# Patient Record
Sex: Male | Born: 1955 | Race: Black or African American | Hispanic: No | Marital: Married | Smoking: Former smoker
Health system: Southern US, Community
[De-identification: ages and names within clinical notes are randomized; demographics above are authoritative.]

## PROBLEM LIST (undated history)

## (undated) DIAGNOSIS — R200 Anesthesia of skin: Secondary | ICD-10-CM

## (undated) DIAGNOSIS — I10 Essential (primary) hypertension: Secondary | ICD-10-CM

## (undated) DIAGNOSIS — E785 Hyperlipidemia, unspecified: Secondary | ICD-10-CM

## (undated) HISTORY — PX: SPINE SURGERY: SHX786

## (undated) HISTORY — DX: Anesthesia of skin: R20.0

## (undated) HISTORY — DX: Essential (primary) hypertension: I10

## (undated) HISTORY — PX: COLONOSCOPY: SHX174

## (undated) HISTORY — DX: Hyperlipidemia, unspecified: E78.5

---

## 2004-02-20 ENCOUNTER — Emergency Department (HOSPITAL_COMMUNITY): Admission: EM | Admit: 2004-02-20 | Discharge: 2004-02-20 | Payer: Self-pay | Admitting: Emergency Medicine

## 2004-05-13 ENCOUNTER — Emergency Department (HOSPITAL_COMMUNITY): Admission: EM | Admit: 2004-05-13 | Discharge: 2004-05-13 | Payer: Self-pay | Admitting: Emergency Medicine

## 2004-05-24 ENCOUNTER — Emergency Department (HOSPITAL_COMMUNITY): Admission: EM | Admit: 2004-05-24 | Discharge: 2004-05-24 | Payer: Self-pay | Admitting: Emergency Medicine

## 2005-10-23 ENCOUNTER — Emergency Department (HOSPITAL_COMMUNITY): Admission: EM | Admit: 2005-10-23 | Discharge: 2005-10-23 | Payer: Self-pay | Admitting: Emergency Medicine

## 2013-08-11 ENCOUNTER — Emergency Department (HOSPITAL_COMMUNITY)
Admission: EM | Admit: 2013-08-11 | Discharge: 2013-08-11 | Disposition: A | Discharge status: Home / Self Care | Payer: BC Managed Care – PPO | Attending: Emergency Medicine | Admitting: Emergency Medicine

## 2013-08-11 ENCOUNTER — Encounter (HOSPITAL_COMMUNITY): Payer: Self-pay | Admitting: Emergency Medicine

## 2013-08-11 DIAGNOSIS — M5431 Sciatica, right side: Secondary | ICD-10-CM

## 2013-08-11 DIAGNOSIS — M5430 Sciatica, unspecified side: Secondary | ICD-10-CM | POA: Insufficient documentation

## 2013-08-11 MED ORDER — NAPROXEN SODIUM 550 MG PO TABS: 550.0000 mg | ORAL_TABLET | Freq: Two times a day (BID) | ORAL | Status: DC

## 2013-08-11 MED ORDER — HYDROCODONE-ACETAMINOPHEN 5-325 MG PO TABS: 1.0000 | ORAL_TABLET | Freq: Four times a day (QID) | ORAL | Status: DC | PRN

## 2013-08-11 MED ORDER — METHOCARBAMOL 500 MG PO TABS: 500.0000 mg | ORAL_TABLET | Freq: Two times a day (BID) | ORAL | Status: DC

## 2013-08-11 NOTE — Discharge Instructions (Signed)
Sciatica °Sciatica is pain, weakness, numbness, or tingling along the path of the sciatic nerve. The nerve starts in the lower back and runs down the back of each leg. The nerve controls the muscles in the lower leg and in the back of the knee, while also providing sensation to the back of the thigh, lower leg, and the sole of your foot. Sciatica is a symptom of another medical condition. For instance, nerve damage or certain conditions, such as a herniated disk or bone spur on the spine, pinch or put pressure on the sciatic nerve. This causes the pain, weakness, or other sensations normally associated with sciatica. Generally, sciatica only affects one side of the body. °CAUSES  °· Herniated or slipped disc. °· Degenerative disk disease. °· A pain disorder involving the narrow muscle in the buttocks (piriformis syndrome). °· Pelvic injury or fracture. °· Pregnancy. °· Tumor (rare). °SYMPTOMS  °Symptoms can vary from mild to very severe. The symptoms usually travel from the low back to the buttocks and down the back of the leg. Symptoms can include: °· Mild tingling or dull aches in the lower back, leg, or hip. °· Numbness in the back of the calf or sole of the foot. °· Burning sensations in the lower back, leg, or hip. °· Sharp pains in the lower back, leg, or hip. °· Leg weakness. °· Severe back pain inhibiting movement. °These symptoms may get worse with coughing, sneezing, laughing, or prolonged sitting or standing. Also, being overweight may worsen symptoms. °DIAGNOSIS  °Your caregiver will perform a physical exam to look for common symptoms of sciatica. He or she may ask you to do certain movements or activities that would trigger sciatic nerve pain. Other tests may be performed to find the cause of the sciatica. These may include: °· Blood tests. °· X-rays. °· Imaging tests, such as an MRI or CT scan. °TREATMENT  °Treatment is directed at the cause of the sciatic pain. Sometimes, treatment is not necessary  and the pain and discomfort goes away on its own. If treatment is needed, your caregiver may suggest: °· Over-the-counter medicines to relieve pain. °· Prescription medicines, such as anti-inflammatory medicine, muscle relaxants, or narcotics. °· Applying heat or ice to the painful area. °· Steroid injections to lessen pain, irritation, and inflammation around the nerve. °· Reducing activity during periods of pain. °· Exercising and stretching to strengthen your abdomen and improve flexibility of your spine. Your caregiver may suggest losing weight if the extra weight makes the back pain worse. °· Physical therapy. °· Surgery to eliminate what is pressing or pinching the nerve, such as a bone spur or part of a herniated disk. °HOME CARE INSTRUCTIONS  °· Only take over-the-counter or prescription medicines for pain or discomfort as directed by your caregiver. °· Apply ice to the affected area for 20 minutes, 3 4 times a day for the first 48 72 hours. Then try heat in the same way. °· Exercise, stretch, or perform your usual activities if these do not aggravate your pain. °· Attend physical therapy sessions as directed by your caregiver. °· Keep all follow-up appointments as directed by your caregiver. °· Do not wear high heels or shoes that do not provide proper support. °· Check your mattress to see if it is too soft. A firm mattress may lessen your pain and discomfort. °SEEK IMMEDIATE MEDICAL CARE IF:  °· You lose control of your bowel or bladder (incontinence). °· You have increasing weakness in the lower back,   pelvis, buttocks, or legs. °· You have redness or swelling of your back. °· You have a burning sensation when you urinate. °· You have pain that gets worse when you lie down or awakens you at night. °· Your pain is worse than you have experienced in the past. °· Your pain is lasting longer than 4 weeks. °· You are suddenly losing weight without reason. °MAKE SURE YOU: °· Understand these  instructions. °· Will watch your condition. °· Will get help right away if you are not doing well or get worse. °Document Released: 04/18/2001 Document Revised: 10/24/2011 Document Reviewed: 09/03/2011 °ExitCare® Patient Information ©2014 [employer]. ° °

## 2013-08-11 NOTE — ED Notes (Signed)
Pt has been having back pain for the past two weeks and has been unable to move much at all. 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

## 2013-08-11 NOTE — ED Provider Notes (Signed)
CSN: [id number]     Arrival date & time 08/11/13  1420 History  This chart was scribed for non-physician practitioner, Etta Quill, NP-C working with Saddie Benders. Dorna Mai, MD by Einar Pheasant, ED scribe. This patient was seen in room TR05C/TR05C and the patient's care was started at 5:58 PM.    Chief Complaint  Patient presents with  . Back Pain   The history is provided by the patient. No language interpreter was used.   HPI Comments: Harold Hayes is a 58 y.o. male who presents to the Emergency Department complaining of gradual onset, worsening back pain that radiates down his right leg that started 2 weeks ago. Pt believes that his pain is due to his recent project at work. 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 Pt states that he has had prior back pain but its never been this bad. He reports taking Naproxen, Aleve PM with no relief. Denies any abdominal pain, nausea, emesis, fever, chills, bladder or bowel incontinence.   PCP: No PCP Per Patient   History reviewed. No pertinent past medical history. History reviewed. No pertinent past surgical history. History reviewed. No pertinent family history. History  Substance Use Topics  . Smoking status: Never Smoker   . Smokeless tobacco: Not on file  . Alcohol Use: No    Review of Systems  Constitutional: Negative for fever, chills and diaphoresis.  HENT: Negative for congestion.   Respiratory: Negative for cough and shortness of breath.   Cardiovascular: Negative for chest pain.  Gastrointestinal: Negative for nausea, vomiting, abdominal pain and diarrhea.  Musculoskeletal: Positive for back pain.  Neurological: Negative for weakness.  All other systems reviewed and are negative.   Allergies  Review of patient's allergies indicates no known allergies.  Home Medications   Current Outpatient Rx  Name  Route  Sig  Dispense  Refill  . gabapentin (NEURONTIN) 300  MG capsule   Oral   Take 300 mg by mouth daily as needed (pain).         Marland Kitchen ibuprofen (ADVIL,MOTRIN) 100 MG tablet   Oral   Take 200 mg by mouth every 6 (six) hours as needed for fever.         . naproxen (NAPROSYN) 250 MG tablet   Oral   Take 500 mg by mouth daily as needed for mild pain.         . traMADol (ULTRAM) 50 MG tablet   Oral   Take 50 mg by mouth every 6 (six) hours as needed.          BP 170/101  Pulse 95  Temp(Src) 97.7 F (36.5 C) (Oral)  Resp 18  SpO2 99%  Physical Exam  Nursing note and vitals reviewed. Constitutional: He is oriented to person, place, and time. He appears well-developed and well-nourished. No distress.  HENT:  Head: Normocephalic and atraumatic.  Eyes: EOM are normal.  Neck: Normal range of motion. Neck supple. No thyromegaly present.  Cardiovascular: Normal rate, regular rhythm and normal heart sounds.  Exam reveals no gallop and no friction rub.   No murmur heard. Pulmonary/Chest: Effort normal. No respiratory distress. He has no wheezes. He has no rales.  Abdominal: Soft. Bowel sounds are normal. He exhibits no distension. There is no tenderness. There is no rebound.  Musculoskeletal: Normal range of motion.  Lymphadenopathy:    He has no cervical adenopathy.  Neurological: He is alert and oriented to person,  place, and time.  Skin: Skin is warm and dry.  Psychiatric: He has a normal mood and affect. His behavior is normal.    ED Course  DIAGNOSTIC STUDIES: Oxygen Saturation is 99% on RA, normal by my interpretation.    COORDINATION OF CARE: 6:04 PM- Will prescribe muscle relaxer and narco for pain control to use as needed. Pt advised of plan for treatment and pt agrees.   Procedures (including critical care time) Labs Review Labs Reviewed - No data to display Imaging Review No results found.   EKG Interpretation None      MDM   Final diagnoses:  None     Low back pain with sciatica. No red flag  symtpoms. Muscle relaxant, anti-inflammatory. Return precautions discussed.  I personally performed the services described in this documentation, which was scribed in my presence. The recorded information has been reviewed and is accurate.    Norman Herrlich, NP 08/12/13 [phone]

## 2013-08-14 NOTE — ED Provider Notes (Signed)
Medical screening examination/treatment/procedure(s) were performed by non-physician practitioner and as supervising physician I was immediately available for consultation/collaboration.   EKG Interpretation None        Saddie Benders. Arisa Congleton, MD 08/14/13 1501

## 2013-09-13 ENCOUNTER — Emergency Department (HOSPITAL_COMMUNITY)
Admission: EM | Admit: 2013-09-13 | Discharge: 2013-09-13 | Disposition: A | Discharge status: Home / Self Care | Payer: BC Managed Care – PPO | Attending: Emergency Medicine | Admitting: Emergency Medicine

## 2013-09-13 ENCOUNTER — Encounter (HOSPITAL_COMMUNITY): Payer: Self-pay | Admitting: Emergency Medicine

## 2013-09-13 DIAGNOSIS — Z791 Long term (current) use of non-steroidal anti-inflammatories (NSAID): Secondary | ICD-10-CM | POA: Insufficient documentation

## 2013-09-13 DIAGNOSIS — Y9389 Activity, other specified: Secondary | ICD-10-CM | POA: Insufficient documentation

## 2013-09-13 DIAGNOSIS — M549 Dorsalgia, unspecified: Secondary | ICD-10-CM

## 2013-09-13 DIAGNOSIS — Y92410 Unspecified street and highway as the place of occurrence of the external cause: Secondary | ICD-10-CM | POA: Insufficient documentation

## 2013-09-13 DIAGNOSIS — S46909A Unspecified injury of unspecified muscle, fascia and tendon at shoulder and upper arm level, unspecified arm, initial encounter: Secondary | ICD-10-CM | POA: Insufficient documentation

## 2013-09-13 DIAGNOSIS — IMO0002 Reserved for concepts with insufficient information to code with codable children: Secondary | ICD-10-CM | POA: Insufficient documentation

## 2013-09-13 DIAGNOSIS — S4980XA Other specified injuries of shoulder and upper arm, unspecified arm, initial encounter: Secondary | ICD-10-CM | POA: Insufficient documentation

## 2013-09-13 DIAGNOSIS — Z79899 Other long term (current) drug therapy: Secondary | ICD-10-CM | POA: Insufficient documentation

## 2013-09-13 MED ORDER — OXYCODONE-ACETAMINOPHEN 5-325 MG PO TABS: 1.0000 | ORAL_TABLET | ORAL | Status: DC | PRN

## 2013-09-13 MED ORDER — METHOCARBAMOL 500 MG PO TABS: 500.0000 mg | ORAL_TABLET | Freq: Two times a day (BID) | ORAL | Status: DC | PRN

## 2013-09-13 NOTE — Discharge Instructions (Signed)
Take the prescribed medication as directed. Follow-up with a primary care physician.  If you do not have one a resource guide has been attached to help with this. Return to the ED for new or worsening symptoms.   Emergency Department Resource Guide 1) Find a Doctor and Pay Out of Pocket Although you won't have to find out who is covered by your insurance plan, it is a good idea to ask around and get recommendations. You will then need to call the office and see if the doctor you have chosen will accept you as a new patient and what types of options they offer for patients who are self-pay. Some doctors offer discounts or will set up payment plans for their patients who do not have insurance, but you will need to ask so you aren't surprised when you get to your appointment.  2) Contact Your Local Health Department Not all health departments have doctors that can see patients for sick visits, but many do, so it is worth a call to see if yours does. If you don't know where your local health department is, you can check in your phone book. The CDC also has a tool to help you locate your state's health department, and many state websites also have listings of all of their local health departments.  3) Find a [facility] If your illness is not likely to be very severe or complicated, you may want to try a walk in clinic. These are popping up all over the country in pharmacies, drugstores, and shopping centers. They're usually staffed by nurse practitioners or physician assistants that have been trained to treat common illnesses and complaints. They're usually fairly quick and inexpensive. However, if you have serious medical issues or chronic medical problems, these are probably not your best option.  No Primary Care Doctor: - Call Health Connect at  [phone] - they can help you locate a primary care doctor that  accepts your insurance, provides certain services, etc. - Physician Referral Service-  [phone]  Chronic Pain Problems: Organization         Address  Phone   Notes  [facility]  [phone] Patients need to be referred by their primary care doctor.   Medication Assistance: Organization         Address  Phone   Notes  [facility] Medication [facility] [address] [place], [place] [address] [phone] --Must be a resident of [facility] -- Must have NO insurance coverage whatsoever (no Medicaid/ Medicare, etc.) -- The pt. MUST have a primary care doctor that directs their care regularly and follows them in the community   MedAssist  [phone]   [employer]  [phone]    Agencies that provide inexpensive medical care: Organization         Address  Phone   Notes  [place]  [phone]   [facility]    [phone]   [facility] [place], [place] [address] [phone]   [place] [address], [place] [phone]   Planned Parenthood    [phone]   [facility]    [phone]   [place] and [address], [place] Phone:  [phone], Fax:  [phone] Hours of Operation:  9 am - 6 pm, M-F.  Also accepts Medicaid/Medicare and self-pay.  [facility] for [place], [address], [place] Phone: [phone], Fax: [phone]. Hours of Operation:  8:30 am - 5:30 pm, M-F.  Also accepts Medicaid and self-pay.  [facility] [place] [address] [place] Phone: [phone]   [place], [place], [place] [phone], Ext. [phone] Mondays & Thursdays: 7-9 AM.  First 15 patients are seen on a first come, first serve basis.    [place] Providers:  Organization         Address  Phone   Notes  [facility] [address],  [place] [phone] Also accepts self-pay patients.  [facility] [id number] [place], [place]  [phone]   [place], [address], [place] [phone]   [facility] [address] [place] [phone]   Lucianne Lei [address], [place]   [phone] Only accepts [place] Access [place] patients after they have their name applied to their card.   Self-Pay (no insurance) in [facility]:  Organization         Address  Phone   Notes  Sickle Cell Patients, [facility] [place] [phone]   [facility] [place] [phone]   [facility] [place]  [place], [place], [place] [phone]   [facility]/Dr. Osei-Bonsu  [address] [place] [address] [place] or [address], [place] [phone] Phone number for both [place] and [place] locations is the same.  Urgent Medical and [facility] [address], [place] [phone]   [facility] [address] [place] or [address] [phone]   [facility] [address], [place] [phone], phone; [phone], fax Sees patients 1st and 3rd Saturday of every month.  Must not qualify for public or private insurance (i.e. Medicaid, Medicare, [place] Health Choice, Veterans' Benefits)  Household income should be no more than 200% of the poverty level The clinic cannot treat you if you are pregnant or think you are pregnant  Sexually transmitted diseases are not treated at the clinic.    Dental Care: Organization         Address  Phone  Notes  [facility] Department of [facility] [place] [phone] Accepts children up to age 81 who are enrolled in  [place] or [place]; pregnant women with a Medicaid card; and children who have applied for Medicaid or [place] Health Choice, but were declined, whose parents can pay a reduced fee at time of service.  [facility] Department of [facility]  [address], [place] [phone] Accepts children up to age 44 who are enrolled in [place] or [place]; pregnant women with a Medicaid card; and children who have applied for Medicaid or  Health Choice, but were declined, whose parents can pay a reduced fee at time of service.  [facility] PRO[facility]  [place] [phone] Patients are seen by appointment only. Walk-ins are not accepted. [place] will see patients 52 years of age and older. Monday - Tuesday (8am-5pm) Most Wednesdays (8:30-5pm) $30 per  visit, cash only  [facility] Adult [employer] PROGRAM  [address], [address] [phone] Patients are seen by appointment only. Walk-ins are not accepted. [place] will see patients 48 years of age and older. One Wednesday Evening (Monthly: Volunteer Based).  $30 per visit, cash only  [place]  [phone] for adults; Children under age 62, call Graduate Pediatric Dentistry at [phone]. Children aged 41-14, please call [phone] to request a pediatric application.  Dental services are provided in all areas of dental care including fillings, crowns and bridges, complete and partial dentures, implants, gum treatment, root canals, and extractions. Preventive care is also provided. Treatment is provided to both adults and children. Patients are selected via a lottery and there is often a waiting list.   [facility] [address] [place]  [phone] [email or web address]   [facility] [address] [place], [place] [phone], Ext. [phone] Second and Fourth Thursday of each month, opens at 6:30  AM; Clinic ends at 9 AM.  Patients are seen on a first-come first-served basis, and a limited number are seen during each clinic.   [facility]  [address] Hillard Danker [place], [place] [phone]   Eligibility Requirements You must have lived in [place], [place], or [place] counties for at least the last three months.   You cannot be eligible for state or federal sponsored [employer], including [employer], [place], or [employer].   You generally cannot be eligible for healthcare insurance through your employer.    How to apply: Eligibility screenings are held every Tuesday and Wednesday afternoon from 1:00 pm until 4:00 pm. You do not need an appointment for the interview!  [facility] [address], [place], [place]   [place]  [place] Department  [place]  [phone]    Behavioral Health Resources in the Community: Intensive Outpatient Programs Organization         Address  Phone  Notes  [place]. [address] [place], [place] [phone]   [facility] Outpatient [address] [place], [place]   ADS: Alcohol & Drug Svcs [address] [place], [place]   [place] [address]  [place], [place] or [phone]   Substance Abuse Resources Organization         Address  Phone  Notes  Alcohol and Drug Services  [phone]   [place]  [phone]   [facility] [place]   Chinita Pester  [phone]   Residential & Outpatient Substance Abuse Program  [phone]   Psychological Services Organization         Address  Phone  Notes  [facility] [employer] [facility] [place]  [place]  [phone]   [place] [address] [place] or  [phone]    Mobile Crisis Teams Organization         Address  Phone  Notes  [facility], Mobile Crisis Care Unit  [phone]   [facility]  [address] [place], [place]   [place] [address], [place] [phone]    Self-Help/Support Groups Organization         Address  Phone  Notes  Mental Health Assoc. of [place] - variety of support groups  [place] Call for more information  Narcotics Anonymous (NA), [facility] [address], [employer] [place]  2 meetings at this location   Special educational needs teacher         Address  Phone  Notes  ASAP Residential Treatment [place],    [place]  [phone]   [facility]  [address] [place] [phone], [place], [place]   [place], [place] [phone] Admissions: 8am-3pm M-F  Incentives Substance [place] [address],    [place], [place] [phone]   [facility] [address] [place], [place], [place]   [facility] [address]  [place], [place]   [facility] - Intensive Outpatient [address] Kristeen Mans [address], [place], [place]   [facility] [place].,  [place], [place] [phone] or [phone]   [facility] Services (RTS) [address], [place], [place] Accepts Medicaid  [facility] [place] [address]  [place] [phone] Substance Abuse/Addiction Treatment   [facility] Organization         Address  Phone  Notes  [facility]  [phone]   Domenic Schwab, PhD [address] Arlis [place], [place]   [phone] or [phone]   [place], [place] [phone]   [facility] [address],  [place], [place] [phone] Insurance/Medicaid/sponsorship through [facility] and Families [address]                                    [place], [place] [phone] [place] [address] [place], [place] [phone]    Dr. Adele Schilder  [phone]   [facility] Dept. 1) [address] [place] 2) [place] 3)  [place] [address], [place] [phone]  [phone]   [place] [phone] or [phone] (After Hours)
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
## 2013-09-13 NOTE — ED Provider Notes (Signed)
Medical screening examination/treatment/procedure(s) were performed by non-physician practitioner and as supervising physician I was immediately available for consultation/collaboration.   EKG Interpretation None        Blanchard Kelch, MD 09/13/13 1530
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
## 2015-07-12 ENCOUNTER — Emergency Department (HOSPITAL_COMMUNITY)
Admission: EM | Admit: 2015-07-12 | Discharge: 2015-07-12 | Disposition: A | Discharge status: Home / Self Care | Payer: No Typology Code available for payment source | Attending: Emergency Medicine | Admitting: Emergency Medicine

## 2015-07-12 ENCOUNTER — Encounter (HOSPITAL_COMMUNITY): Payer: Self-pay | Admitting: Emergency Medicine

## 2015-07-12 DIAGNOSIS — Z79899 Other long term (current) drug therapy: Secondary | ICD-10-CM | POA: Diagnosis not present

## 2015-07-12 DIAGNOSIS — Y9389 Activity, other specified: Secondary | ICD-10-CM | POA: Diagnosis not present

## 2015-07-12 DIAGNOSIS — S161XXA Strain of muscle, fascia and tendon at neck level, initial encounter: Secondary | ICD-10-CM | POA: Diagnosis not present

## 2015-07-12 DIAGNOSIS — S3992XA Unspecified injury of lower back, initial encounter: Secondary | ICD-10-CM | POA: Diagnosis present

## 2015-07-12 DIAGNOSIS — I159 Secondary hypertension, unspecified: Secondary | ICD-10-CM | POA: Insufficient documentation

## 2015-07-12 DIAGNOSIS — S29002A Unspecified injury of muscle and tendon of back wall of thorax, initial encounter: Secondary | ICD-10-CM | POA: Insufficient documentation

## 2015-07-12 DIAGNOSIS — Z791 Long term (current) use of non-steroidal anti-inflammatories (NSAID): Secondary | ICD-10-CM | POA: Insufficient documentation

## 2015-07-12 DIAGNOSIS — Y92410 Unspecified street and highway as the place of occurrence of the external cause: Secondary | ICD-10-CM | POA: Insufficient documentation

## 2015-07-12 DIAGNOSIS — Y998 Other external cause status: Secondary | ICD-10-CM | POA: Diagnosis not present

## 2015-07-12 DIAGNOSIS — S39012A Strain of muscle, fascia and tendon of lower back, initial encounter: Secondary | ICD-10-CM

## 2015-07-12 MED ORDER — HYDROCODONE-ACETAMINOPHEN 5-325 MG PO TABS
1.0000 | ORAL_TABLET | Freq: Once | ORAL | Status: AC
  Administered 2015-07-12: 1 via ORAL
  Filled 2015-07-12: qty 1

## 2015-07-12 MED ORDER — METHOCARBAMOL 500 MG PO TABS: 500.0000 mg | ORAL_TABLET | Freq: Two times a day (BID) | ORAL | Status: DC

## 2015-07-12 MED ORDER — HYDROCODONE-ACETAMINOPHEN 5-325 MG PO TABS
1.0000 | ORAL_TABLET | Freq: Four times a day (QID) | ORAL | Status: DC | PRN
Start: 2015-07-12 — End: 2016-06-20

## 2015-07-12 MED ORDER — NAPROXEN 500 MG PO TABS: 500.0000 mg | ORAL_TABLET | Freq: Two times a day (BID) | ORAL | Status: DC

## 2015-07-12 MED ORDER — METHOCARBAMOL 500 MG PO TABS
750.0000 mg | ORAL_TABLET | Freq: Once | ORAL | Status: AC
  Administered 2015-07-12: 750 mg via ORAL
  Filled 2015-07-12: qty 2

## 2015-07-12 MED ORDER — IBUPROFEN 200 MG PO TABS
600.0000 mg | ORAL_TABLET | Freq: Once | ORAL | Status: AC
  Administered 2015-07-12: 600 mg via ORAL
  Filled 2015-07-12: qty 3
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
## 2016-06-20 ENCOUNTER — Encounter (INDEPENDENT_AMBULATORY_CARE_PROVIDER_SITE_OTHER): Payer: Self-pay

## 2016-06-20 ENCOUNTER — Ambulatory Visit (INDEPENDENT_AMBULATORY_CARE_PROVIDER_SITE_OTHER): Payer: 59 | Admitting: Pulmonary Disease

## 2016-06-20 ENCOUNTER — Encounter: Payer: Self-pay | Admitting: Pulmonary Disease

## 2016-06-20 ENCOUNTER — Ambulatory Visit (HOSPITAL_COMMUNITY)
Admission: RE | Admit: 2016-06-20 | Discharge: 2016-06-20 | Disposition: A | Discharge status: Home / Self Care | Payer: 59 | Source: Ambulatory Visit | Attending: Family Medicine | Admitting: Family Medicine

## 2016-06-20 VITALS — BP 192/100 | HR 85 | Temp 97.5°F | Ht 72.0 in | Wt 276.9 lb

## 2016-06-20 DIAGNOSIS — Z833 Family history of diabetes mellitus: Secondary | ICD-10-CM | POA: Diagnosis not present

## 2016-06-20 DIAGNOSIS — R202 Paresthesia of skin: Secondary | ICD-10-CM

## 2016-06-20 DIAGNOSIS — Z87891 Personal history of nicotine dependence: Secondary | ICD-10-CM | POA: Diagnosis not present

## 2016-06-20 DIAGNOSIS — I10 Essential (primary) hypertension: Secondary | ICD-10-CM | POA: Diagnosis not present

## 2016-06-20 DIAGNOSIS — R200 Anesthesia of skin: Secondary | ICD-10-CM

## 2016-06-20 DIAGNOSIS — Z8249 Family history of ischemic heart disease and other diseases of the circulatory system: Secondary | ICD-10-CM | POA: Diagnosis not present

## 2016-06-20 DIAGNOSIS — Z0000 Encounter for general adult medical examination without abnormal findings: Secondary | ICD-10-CM | POA: Insufficient documentation

## 2016-06-20 DIAGNOSIS — Z1211 Encounter for screening for malignant neoplasm of colon: Secondary | ICD-10-CM

## 2016-06-20 HISTORY — DX: Anesthesia of skin: R20.0

## 2016-06-20 MED ORDER — LISINOPRIL-HYDROCHLOROTHIAZIDE 20-12.5 MG PO TABS: 1.0000 | ORAL_TABLET | Freq: Every day | ORAL | 0 refills | Status: DC

## 2016-06-20 NOTE — Patient Instructions (Signed)
Start taking lisinopril-hydrochlorothiazide once a day Follow up in 2 weeks

## 2016-06-20 NOTE — Assessment & Plan Note (Signed)
Colonoscopy referral placed

## 2016-06-20 NOTE — Progress Notes (Signed)
   CC: Arm numbness  HPI:  [name] is a 61 y.o. man with history of elevated blood pressure presenting with intermittent arm numbness.  He gets intermittent bilateral arm paresthesias. Located in upper arm and down to his fingers. Occasionally lasts a few hours. He has to rest and sometimes even leans over when it happens. Feels like a jolt. No chest pain. He does get dyspneic. Does not have it currently. Does not notice it to be positional. This has been going on for two years. Comes and goes...every few weeks. He does maintenance work.    Past Medical History:  Diagnosis Date  . Hypertension    No past surgical history on file.  Family History  Problem Relation Age of Onset  . Diabetes Mother   . Diabetes Sister   . Hypertension Daughter   . Diabetes Sister     Social History   Social History  . Marital status: Married    Spouse name: N/A  . Number of children: N/A  . Years of education: N/A   Social History Main Topics  . Smoking status: Former Smoker    Packs/day: 1.50    Years: 20.00    Types: Cigarettes    Quit date: 05/08/1988  . Smokeless tobacco: Never Used  . Alcohol use No  . 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 Sexual activity: Yes    Birth control/ protection: None   Other Topics Concern  . None   Social History Narrative  . None    Review of Systems:   Constitutional: no fevers/chills, no weight loss Eyes: no vision changes Ears, nose, mouth, throat, and face: no cough Respiratory: see HPI Cardiovascular: no chest pain Gastrointestinal: no nausea/vomiting, no abdominal pain, no constipation, no diarrhea Genitourinary: no dysuria, no hematuria Integument: no rash Hematologic/lymphatic: no bleeding/bruising, no edema Musculoskeletal: no arthralgias, no myalgias Neurological: see HPI   Physical Exam:  Vitals:   06/20/16 1538  BP: (!) 192/100  Pulse: 85  Temp: 97.5 F (36.4 C)  TempSrc: Oral    SpO2: 100%  Weight: 276 lb 14.4 oz (125.6 kg)  Height: 6' (1.829 m)   General Apperance: NAD HEENT: Normocephalic, atraumatic, anicteric sclera Neck: Supple, trachea midline Lungs: Clear to auscultation bilaterally. No wheezes, rhonchi or rales. Breathing comfortably Heart: Regular rate and rhythm, no murmur/rub/gallop Abdomen: Soft, nontender, nondistended, no rebound/guarding Extremities: Warm and well perfused, no edema Skin: No rashes or lesions Neurologic: Alert and interactive. Strength 5/5 bilateral upper and lower extremities. Negative Spurling test. Sensation intact bilaterally.   I independently reviewed his EKG which snows normal sinus rhythm, left ventricular hypertrophy, ST elevation in V1-2 that looks like J point elevation. No past EKG for comparison  Assessment & Plan:   See Encounters Tab for problem based charting.  Patient discussed with Dr. Lynnae January

## 2016-06-20 NOTE — Assessment & Plan Note (Signed)
Assessment: BP 192/100 today. He has been hypertensive in the past on visits to the ED.  Plan: Start lisinopril-hydrochlorothizide 20-12.5mg  daily. Briefly discussed DASH diet and weight loss. Will discuss further at follow up BMP Follow up in 2 weeks

## 2016-06-20 NOTE — Assessment & Plan Note (Addendum)
Assessment: He gets intermittent bilateral arm paresthesias. Assessment: Bilateral arm paresthesias that are intermittent and occur once every week or so and nonpositional. No chest pain. He does get dyspneic. This has been going on for two years. He does maintenance work. No trauma to cervical spine. No focal deficits on neuro exam today. EKG without acute ischemic changes.  Plan: Observe for now. Will treat hypertension.

## 2016-06-21 ENCOUNTER — Encounter: Payer: Self-pay | Admitting: Gastroenterology

## 2016-06-21 LAB — BMP8+ANION GAP
Anion Gap: 18 mmol/L (ref 10.0–18.0)
BUN/Creatinine Ratio: 8 — ABNORMAL LOW (ref 10–24)
BUN: 10 mg/dL (ref 8–27)
CO2: 22 mmol/L (ref 18–29)
Calcium: 9 mg/dL (ref 8.6–10.2)
Chloride: 102 mmol/L (ref 96–106)
Creatinine, Ser: 1.18 mg/dL (ref 0.76–1.27)
GFR calc Af Amer: 77 mL/min/{1.73_m2} (ref >59)
GFR calc non Af Amer: 67 mL/min/{1.73_m2} (ref >59)
Glucose: 89 mg/dL (ref 65–99)
Potassium: 4.1 mmol/L (ref 3.5–5.2)
Sodium: 142 mmol/L (ref 134–144)

## 2016-06-23 NOTE — Progress Notes (Signed)
Internal Medicine Clinic Attending  Case discussed with Dr. Krall soon after the resident saw the patient.  We reviewed the resident's history and exam and pertinent patient test results.  I agree with the assessment, diagnosis, and plan of care documented in the resident's note. 

## 2016-07-12 ENCOUNTER — Ambulatory Visit (INDEPENDENT_AMBULATORY_CARE_PROVIDER_SITE_OTHER): Payer: 59 | Admitting: Internal Medicine

## 2016-07-12 ENCOUNTER — Ambulatory Visit (HOSPITAL_COMMUNITY)
Admission: RE | Admit: 2016-07-12 | Discharge: 2016-07-12 | Disposition: A | Discharge status: Home / Self Care | Payer: 59 | Source: Ambulatory Visit | Attending: Internal Medicine | Admitting: Internal Medicine

## 2016-07-12 VITALS — BP 123/64 | HR 90 | Temp 97.7°F | Ht 72.0 in | Wt 266.8 lb

## 2016-07-12 DIAGNOSIS — R202 Paresthesia of skin: Secondary | ICD-10-CM | POA: Insufficient documentation

## 2016-07-12 DIAGNOSIS — Z87891 Personal history of nicotine dependence: Secondary | ICD-10-CM

## 2016-07-12 DIAGNOSIS — Z79899 Other long term (current) drug therapy: Secondary | ICD-10-CM | POA: Diagnosis not present

## 2016-07-12 DIAGNOSIS — R200 Anesthesia of skin: Secondary | ICD-10-CM | POA: Insufficient documentation

## 2016-07-12 DIAGNOSIS — M4802 Spinal stenosis, cervical region: Secondary | ICD-10-CM | POA: Diagnosis not present

## 2016-07-12 DIAGNOSIS — M47892 Other spondylosis, cervical region: Secondary | ICD-10-CM | POA: Diagnosis not present

## 2016-07-12 DIAGNOSIS — I10 Essential (primary) hypertension: Secondary | ICD-10-CM

## 2016-07-12 NOTE — Patient Instructions (Signed)
We will check some labs and do xray of your spine to figure out why you are having the arm numbness.  Keep taking your meds for now.  Follow up in 2 weeks to recheck.

## 2016-07-12 NOTE — Assessment & Plan Note (Addendum)
Unclear etiology, going on for 2-3 years but more frequent. Lasts only few mins and goes away with rest. ?whether neck movement worsens or helps.  ddx include cspine stenosis vs polyneuropathy from metabolic or infectious problems   -cspine Xray -neuropathy labs TSH, b12, MMA, RPR, HIV, Hep C -f/up in 2 weeks. If all negative, may consider MRI cspine +/- brain imaging.   Addendum: labs normal. 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 Discussed results with the patient and offered him physical therapy as the first step. He decided to wait for now and will let us know if it gets too bothersome.

## 2016-07-12 NOTE — Progress Notes (Signed)
   CC: b/l hand numbness  HPI:  [name] is a 61 y.o. with PMH as listed below presents with b/l hand numbness  Past Medical History:  Diagnosis Date  . Hypertension   \ Has b/l arm numbness for last 2-3 years which used to be infrequent in the past but becoming more frequent. He gets these episodes suddenly when he gets b/l arm numbness/tingling radiating down to his hands. He rests when it happens and symptoms resolve within few mins. Since last visit 06/20/16 he is having more frequent episodes Was started on lisinopril-hctz last visit due to high BP. No neck pain, no neck or head or shoulder injuries. No new meds other than BP meds. No head injury. 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 No dizziness or HA or vision changes.    Review of Systems:    Review of Systems  Eyes: Negative for blurred vision and double vision.  Cardiovascular: Negative for chest pain and palpitations.  Gastrointestinal: Negative for heartburn, nausea and vomiting.  Genitourinary: Negative for dysuria.  Neurological: Positive for tingling and focal weakness. Negative for dizziness and headaches.     Physical Exam:  Vitals:   07/12/16 1546  BP: 123/64  Pulse: 90  Temp: 97.7 F (36.5 C)  TempSrc: Oral  SpO2: 100%  Weight: 266 lb 12.8 oz (121 kg)  Height: 6' (1.829 m)   Physical Exam  Constitutional: He is oriented to person, place, and time. He appears well-developed and well-nourished.  HENT:  Head: Normocephalic and atraumatic.  Eyes: EOM are normal. Pupils are equal, round, and reactive to light. Right eye exhibits no discharge. Left eye exhibits no discharge.  Mild Left scleral injection.   Neck: Normal range of motion. No JVD present. No tracheal deviation present. No thyromegaly present.  Cardiovascular: Normal rate and regular rhythm.  Exam reveals no gallop and no friction rub.   No murmur heard. Respiratory: Effort normal and breath  sounds normal. No respiratory distress. He has no wheezes.  GI: Soft. Bowel sounds are normal. He exhibits no distension.  Musculoskeletal: Normal range of motion. He exhibits no edema, tenderness or deformity.  Lymphadenopathy:    He has no cervical adenopathy.  Neurological: He is alert and oriented to person, place, and time.  Sensation intact b/l. Good ROM of shoulder, elbow, and neck joints. No tenderness over these joints. No tenderness over Cspine or neck. Good strength throughout.     Assessment & Plan:   See Encounters Tab for problem based charting.  Patient discussed with Dr. Lynnae January

## 2016-07-14 NOTE — Progress Notes (Signed)
Internal Medicine Clinic Attending  Case discussed with Dr. Ahmed at the time of the visit.  We reviewed the resident's history and exam and pertinent patient test results.  I agree with the assessment, diagnosis, and plan of care documented in the resident's note. 

## 2016-07-15 LAB — METHYLMALONIC ACID, SERUM: Methylmalonic Acid: 185 nmol/L (ref 0–378)

## 2016-07-15 LAB — RPR: RPR Ser Ql: NONREACTIVE

## 2016-07-15 LAB — HIV ANTIBODY (ROUTINE TESTING W REFLEX): HIV Screen 4th Generation wRfx: NONREACTIVE

## 2016-07-15 LAB — TSH: TSH: 2.13 u[IU]/mL (ref 0.450–4.500)

## 2016-07-15 LAB — VITAMIN B12: Vitamin B-12: 317 pg/mL (ref 232–1245)

## 2016-07-15 LAB — HEPATITIS C ANTIBODY: Hep C Virus Ab: <0.1 s/co ratio (ref 0.0–0.9)

## 2016-07-17 MED ORDER — LISINOPRIL-HYDROCHLOROTHIAZIDE 20-12.5 MG PO TABS: 1.0000 | ORAL_TABLET | Freq: Every day | ORAL | 2 refills | Status: DC

## 2016-07-17 NOTE — Addendum Note (Signed)
Addended by: Dellia Nims on: 07/17/2016 09:06 AM   Modules accepted: Orders

## 2016-07-17 NOTE — Assessment & Plan Note (Signed)
Refilled BP med.

## 2016-07-26 ENCOUNTER — Ambulatory Visit: Payer: 59

## 2016-08-02 ENCOUNTER — Ambulatory Visit: Payer: 59

## 2016-08-16 ENCOUNTER — Ambulatory Visit (AMBULATORY_SURGERY_CENTER): Payer: Self-pay

## 2016-08-16 VITALS — Ht 72.0 in | Wt 267.4 lb

## 2016-08-16 DIAGNOSIS — Z1211 Encounter for screening for malignant neoplasm of colon: Secondary | ICD-10-CM

## 2016-08-16 MED ORDER — NA SULFATE-K SULFATE-MG SULF 17.5-3.13-1.6 GM/177ML PO SOLN: 1.0000 | Freq: Once | ORAL | 0 refills | Status: AC

## 2016-08-16 NOTE — Progress Notes (Signed)
Denies allergies to eggs or soy products. Denies complication of anesthesia or sedation. Denies use of weight loss medication. Denies use of O2.   Emmi instructions declined does not have email.

## 2016-08-17 ENCOUNTER — Encounter: Payer: Self-pay | Admitting: Gastroenterology

## 2016-08-30 ENCOUNTER — Ambulatory Visit: Payer: 59 | Admitting: Gastroenterology

## 2016-08-30 ENCOUNTER — Encounter: Payer: Self-pay | Admitting: Gastroenterology

## 2016-08-30 VITALS — BP 91/67 | HR 60 | Temp 97.3°F | Resp 11 | Ht 72.0 in | Wt 267.0 lb

## 2016-08-30 DIAGNOSIS — Z1211 Encounter for screening for malignant neoplasm of colon: Secondary | ICD-10-CM | POA: Diagnosis not present

## 2016-08-30 DIAGNOSIS — D123 Benign neoplasm of transverse colon: Secondary | ICD-10-CM | POA: Diagnosis not present

## 2016-08-30 DIAGNOSIS — D122 Benign neoplasm of ascending colon: Secondary | ICD-10-CM | POA: Diagnosis not present

## 2016-08-30 DIAGNOSIS — D125 Benign neoplasm of sigmoid colon: Secondary | ICD-10-CM | POA: Diagnosis not present

## 2016-08-30 MED ORDER — SODIUM CHLORIDE 0.9 % IV SOLN: 500.0000 mL | INTRAVENOUS | Status: DC

## 2016-08-30 NOTE — Progress Notes (Signed)
Called to room to assist during endoscopic procedure.  Patient ID and intended procedure confirmed with present staff. Received instructions for my participation in the procedure from the performing physician.  

## 2016-08-30 NOTE — Op Note (Signed)
[facility] Patient Name: Harold Hayes Procedure Date: 08/30/2016 10:44 AM MRN: [id number] Endoscopist: Rachael Fee , MD Age: 61 Referring MD:  Date of Birth: [birth year] Gender: Male Account #: [id number] Procedure:                Colonoscopy Indications:              Screening for colorectal malignant neoplasm Medicines:                Monitored Anesthesia Care Procedure:                Pre-Anesthesia Assessment:                           - Prior to the procedure, a History and Physical                            was performed, and patient medications and                            allergies were reviewed. The patient's tolerance of                            previous anesthesia was also reviewed. The risks                            and benefits of the procedure and the sedation                            options and risks were discussed with the patient.                            All questions were answered, and informed consent                            was obtained. Prior Anticoagulants: The patient has                            taken no previous anticoagulant or antiplatelet                            agents. ASA Grade Assessment: II - A patient with                            mild systemic disease. After reviewing the risks                            and benefits, the patient was deemed in                            satisfactory condition to undergo the procedure.                           After obtaining informed consent, the colonoscope  was passed under direct vision. Throughout the                            procedure, the patient's blood pressure, pulse, and                            oxygen saturations were monitored continuously. The                            Colonoscope was introduced through the anus and                            advanced to the the cecum, identified by                            appendiceal orifice and  ileocecal valve. The                            colonoscopy was performed without difficulty. The                            patient tolerated the procedure well. The quality                            of the bowel preparation was excellent. The                            ileocecal valve, appendiceal orifice, and rectum                            were photographed. Scope In: 10:53:52 AM Scope Out: 11:05:15 AM Scope Withdrawal Time: 0 hours 8 minutes 29 seconds  Total Procedure Duration: 0 hours 11 minutes 23 seconds  Findings:                 Two sessile polyps were found in the transverse                            colon and ascending colon. The polyps were 3 to 5                            mm in size. These polyps were removed with a cold                            snare. Resection and retrieval were complete.                           A 7 mm polyp was found in the sigmoid colon. The                            polyp was semi-pedunculated. The polyp was removed                            with a hot  snare. Resection and retrieval were                            complete.                           Multiple small and large-mouthed diverticula were                            found in the entire colon.                           The exam was otherwise without abnormality on                            direct and retroflexion views. Complications:            No immediate complications. Estimated blood loss:                            None. Estimated Blood Loss:     Estimated blood loss: none. Impression:               - Two 3 to 5 mm polyps in the transverse colon and                            in the ascending colon, removed with a cold snare.                            Resected and retrieved.                           - One 7 mm polyp in the sigmoid colon, removed with                            a hot snare. Resected and retrieved.                           - Diverticulosis in the entire  examined colon.                           - The examination was otherwise normal on direct                            and retroflexion views. Recommendation:           - Patient has a contact number available for                            emergencies. The signs and symptoms of potential                            delayed complications were discussed with the                            patient. Return to normal activities tomorrow.  Written discharge instructions were provided to the                            patient.                           - Resume previous diet.                           - Continue present medications.                           You will receive a letter within 2-3 weeks with the                            pathology results and my final recommendations.                           If the polyp(s) is proven to be 'pre-cancerous' on                            pathology, you will need repeat colonoscopy in 3-5                            years. If the polyp(s) is NOT 'precancerous' on                            pathology then you should repeat colon cancer                            screening in 10 years with colonoscopy without need                            for colon cancer screening by any method prior to                            then (including stool testing). Rachael Fee, MD 08/30/2016 7:48:41 PM This report has been signed electronically.

## 2016-08-30 NOTE — Progress Notes (Signed)
Pt's states no medical or surgical changes since previsit or office visit. 

## 2016-08-30 NOTE — Patient Instructions (Signed)
Handout given on polyps  YOU HAD AN ENDOSCOPIC PROCEDURE TODAY: Refer to the procedure report and other information in the discharge instructions given to you for any specific questions about what was found during the examination. If this information does not answer your questions, please call [place] office at [phone] to clarify.   YOU SHOULD EXPECT: Some feelings of bloating in the abdomen. Passage of more gas than usual. Walking can help get rid of the air that was put into your GI tract during the procedure and reduce the bloating. If you had a lower endoscopy (such as a colonoscopy or flexible sigmoidoscopy) you may notice spotting of blood in your stool or on the toilet paper. Some abdominal soreness may be present for a day or two, also.  DIET: Your first meal following the procedure should be a light meal and then it is ok to progress to your normal diet. A half-sandwich or bowl of soup is an example of a good first meal. Heavy or fried foods are harder to digest and may make you feel nauseous or bloated. Drink plenty of fluids but you should avoid alcoholic beverages for 24 hours. If you had a esophageal dilation, please see attached instructions for diet.    ACTIVITY: Your care partner should take you home directly after the procedure. You should plan to take it easy, moving slowly for the rest of the day. You can resume normal activity the day after the procedure however YOU SHOULD NOT DRIVE, use power tools, machinery or perform tasks that involve climbing or major physical exertion for 24 hours (because of the sedation medicines used during the test).   SYMPTOMS TO REPORT IMMEDIATELY: A gastroenterologist can be reached at any hour. Please call [phone]  for any of the following symptoms:  Following lower endoscopy (colonoscopy, flexible sigmoidoscopy) Excessive amounts of blood in the stool  Significant tenderness, worsening of abdominal pains  Swelling of the abdomen that is  new, acute  Fever of 100 or higher    FOLLOW UP:  If any biopsies were taken you will be contacted by phone or by letter within the next 1-3 weeks. Call [phone]  if you have not heard about the biopsies in 3 weeks.  Please also call with any specific questions about appointments or follow up tests.  

## 2016-08-30 NOTE — Progress Notes (Signed)
To recovery, report to Oliver, RN, VSS 

## 2016-08-31 ENCOUNTER — Telehealth: Payer: Self-pay | Admitting: *Deleted

## 2016-08-31 NOTE — Telephone Encounter (Signed)
  Follow up Call-  Call back number 08/30/2016  Post procedure Call Back phone  # [phone]  Permission to leave phone message Yes  Some recent data might be hidden     Patient questions:  Do you have a fever, pain , or abdominal swelling? No. Pain Score  0 *  Have you tolerated food without any problems? Yes.    Have you been able to return to your normal activities? Yes.    Do you have any questions about your discharge instructions: Diet   Yes.   Medications  Yes.   Follow up visit  Yes.    Do you have questions or concerns about your Care? No.  Actions: * If pain score is 4 or above: No action needed, pain <4.

## 2016-08-31 NOTE — Telephone Encounter (Signed)
Left message on callback 

## 2016-09-05 ENCOUNTER — Encounter: Payer: Self-pay | Admitting: Gastroenterology

## 2016-10-18 ENCOUNTER — Other Ambulatory Visit: Payer: Self-pay | Admitting: Internal Medicine

## 2017-01-18 ENCOUNTER — Other Ambulatory Visit: Payer: Self-pay | Admitting: Internal Medicine

## 2017-03-22 ENCOUNTER — Emergency Department (HOSPITAL_COMMUNITY)
Admission: EM | Admit: 2017-03-22 | Discharge: 2017-03-22 | Disposition: A | Discharge status: Home / Self Care | Payer: 59 | Attending: Physician Assistant | Admitting: Physician Assistant

## 2017-03-22 ENCOUNTER — Encounter (HOSPITAL_COMMUNITY): Payer: Self-pay | Admitting: *Deleted

## 2017-03-22 DIAGNOSIS — Z87891 Personal history of nicotine dependence: Secondary | ICD-10-CM | POA: Diagnosis not present

## 2017-03-22 DIAGNOSIS — Z79899 Other long term (current) drug therapy: Secondary | ICD-10-CM | POA: Diagnosis not present

## 2017-03-22 DIAGNOSIS — M79602 Pain in left arm: Secondary | ICD-10-CM | POA: Insufficient documentation

## 2017-03-22 DIAGNOSIS — M25519 Pain in unspecified shoulder: Secondary | ICD-10-CM | POA: Diagnosis not present

## 2017-03-22 DIAGNOSIS — I10 Essential (primary) hypertension: Secondary | ICD-10-CM | POA: Diagnosis not present

## 2017-03-22 DIAGNOSIS — M791 Myalgia, unspecified site: Secondary | ICD-10-CM | POA: Diagnosis not present

## 2017-03-22 DIAGNOSIS — M25521 Pain in right elbow: Secondary | ICD-10-CM | POA: Diagnosis not present

## 2017-03-22 DIAGNOSIS — M549 Dorsalgia, unspecified: Secondary | ICD-10-CM | POA: Insufficient documentation

## 2017-03-22 MED ORDER — NAPROXEN 500 MG PO TABS: 500.0000 mg | ORAL_TABLET | Freq: Two times a day (BID) | ORAL | 0 refills | Status: AC

## 2017-03-22 MED ORDER — CYCLOBENZAPRINE HCL 5 MG PO TABS: 10.0000 mg | ORAL_TABLET | Freq: Every evening | ORAL | 0 refills | Status: DC | PRN

## 2017-03-22 NOTE — ED Notes (Signed)
Pt verbalized understanding of discharge instructions and denies any further questions at this time.   
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
## 2017-03-22 NOTE — Discharge Instructions (Signed)
Take naproxen twice a day with meals to help with muscle pain and swelling.  Do not take other anti-inflammatories at the same time (Advil, Motrin, ibuprofen, Aleve).  You may supplement with Tylenol if you need further pain control. Use Flexeril as needed at night for muscle stiffness or soreness.  Have caution, as this may make you tired or groggy. Use ice or heat if this helps control your pain. You will likely have continued muscle stiffness and soreness over the next several days.  You should follow-up with your primary care doctor in 1 week if your symptoms are not improving. Return to the emergency room if you develop vision changes, slurred speech, loss of bowel or bladder control, or any new or worsening symptoms.

## 2017-05-11 ENCOUNTER — Other Ambulatory Visit: Payer: Self-pay | Admitting: *Deleted

## 2017-05-11 MED ORDER — LISINOPRIL-HYDROCHLOROTHIAZIDE 20-12.5 MG PO TABS: 1.0000 | ORAL_TABLET | Freq: Every day | ORAL | 2 refills | Status: DC

## 2017-08-17 ENCOUNTER — Other Ambulatory Visit: Payer: Self-pay | Admitting: *Deleted

## 2017-08-17 MED ORDER — LISINOPRIL-HYDROCHLOROTHIAZIDE 20-12.5 MG PO TABS: 1.0000 | ORAL_TABLET | Freq: Every day | ORAL | 2 refills | Status: DC

## 2017-09-28 ENCOUNTER — Encounter: Payer: Self-pay | Admitting: Internal Medicine

## 2017-09-28 ENCOUNTER — Other Ambulatory Visit: Payer: Self-pay

## 2017-09-28 ENCOUNTER — Ambulatory Visit: Payer: 59 | Admitting: Internal Medicine

## 2017-09-28 VITALS — BP 129/76 | HR 69 | Temp 97.7°F | Ht 73.0 in | Wt 273.3 lb

## 2017-09-28 DIAGNOSIS — R05 Cough: Secondary | ICD-10-CM | POA: Diagnosis not present

## 2017-09-28 DIAGNOSIS — R053 Chronic cough: Secondary | ICD-10-CM | POA: Insufficient documentation

## 2017-09-28 DIAGNOSIS — R350 Frequency of micturition: Secondary | ICD-10-CM | POA: Insufficient documentation

## 2017-09-28 DIAGNOSIS — Z79899 Other long term (current) drug therapy: Secondary | ICD-10-CM | POA: Diagnosis not present

## 2017-09-28 DIAGNOSIS — I10 Essential (primary) hypertension: Secondary | ICD-10-CM

## 2017-09-28 MED ORDER — HYDROCHLOROTHIAZIDE 25 MG PO TABS: 25.0000 mg | ORAL_TABLET | Freq: Every day | ORAL | 2 refills | Status: DC

## 2017-09-28 MED ORDER — TAMSULOSIN HCL 0.4 MG PO CAPS: 0.4000 mg | ORAL_CAPSULE | Freq: Every day | ORAL | 2 refills | Status: DC

## 2017-09-28 NOTE — Progress Notes (Signed)
Internal Medicine Clinic Attending  Case discussed with Dr. Rice at the time of the visit.  We reviewed the resident's history and exam and pertinent patient test results.  I agree with the assessment, diagnosis, and plan of care documented in the resident's note.  

## 2017-09-28 NOTE — Assessment & Plan Note (Signed)
He does not have major risk factors for malignancy and pulmonary exam is normal. Dry, nonproductive cough fits closely with the time of starting his lisinopril. Plan: Stop lisinopril and check for resolution of cough after at least a month If the cough is not resolving, he will need repeat chest x-ray for further investigation.

## 2017-09-28 NOTE — Progress Notes (Signed)
CC: Follow up for hypertension  HPI:  [name] is a 62 y.o. male with PMHx detailed below presenting for follow-up of his hypertension.  Last he was started on lisinopril hydrochlorothiazide with good improvement in his blood pressure but does think that he is been coughing since starting this medicine.  He has a dry cough present every day not associated with any chest pain, shortness of breath, wheezing, or upper airway congestion.  He is a former smoker but well over a decade ago.  He does not have any complaints of seasonal allergies or acid reflux.  He also reports a new issue of increased urinary frequency and a sense of incomplete voiding.  He does not recall a specific onset but has noticed this a lot more since the start of this year.  He now has nocturia about 2-3 times per night.  He will also sometimes walk to the bathroom and be unable to initiate a urine stream at all.  See problem based assessment and plan below for additional details.  Essential hypertension His blood pressure is well controlled today at 129/76 but likely has a chronic cough associated with the lisinopril.  He does not have diabetes or other known nephropathy so we will try just stopping his ACE inhibitor at this time. Plan: Discontinue lisinopril-hydrochlorothiazide 20-12.5mg  Start hydrochlorthiazide 25 mg daily  Chronic cough He does not have major risk factors for malignancy and pulmonary exam is normal. Dry, nonproductive cough fits closely with the time of starting his lisinopril. Plan: Stop lisinopril and check for resolution of cough after at least a month If the cough is not resolving, he will need repeat chest x-ray for further investigation.  Urinary frequency He describes characteristic bladder outlet obstruction symptoms and exam indicates an enlarged prostate.  Given age and ethnicity he has risk for prostate cancer so we will also check PSA in addition to starting treatment for  presumed BPH. Plan: Start tamsulosin 0.4 mg daily If symptoms are not improving at follow-up consider a more aggressive alpha-blocker or starting finasteride Check PSA If PSA elevated, would refer to urology for further evaluation and management   Past Medical History:  Diagnosis Date  . Hypertension     Review of Systems: Review of Systems  Constitutional: Negative for chills and fever.  HENT: Negative for congestion.   Eyes: Negative for blurred vision.  Respiratory: Positive for cough. Negative for sputum production, shortness of breath and wheezing.   Cardiovascular: Negative for chest pain and leg swelling.  Gastrointestinal: Negative for heartburn.  Genitourinary: Positive for frequency. Negative for dysuria, flank pain and hematuria.  Musculoskeletal: Negative for joint pain.  Skin: Negative for rash.  Neurological: Negative for dizziness.  Endo/Heme/Allergies: Negative for environmental allergies.  Psychiatric/Behavioral: Negative for substance abuse.     Physical Exam: Vitals:   09/28/17 1339  BP: 129/76  Pulse: 69  Temp: 97.7 F (36.5 C)  TempSrc: Oral  SpO2: 100%  Weight: 273 lb 4.8 oz (124 kg)  Height: 6\' 1"  (1.854 m)   GENERAL- alert, co-operative, NAD HEENT- Atraumatic, oral mucosa appears moist CARDIAC- RRR, no murmurs, rubs or gallops. RESP- CTAB, no wheezes or crackles. ABDOMINAL-no suprapubic fullness or tenderness GU- firm, smooth, prostate >3cm without tenderness or nodularity NEURO-normal symmetric patellar reflexes EXTREMITIES- symmetric, no pedal edema. SKIN- Warm, dry, No rash or lesion. PSYCH- Normal mood and affect, appropriate thought content and speech.   Assessment & Plan:   See encounters tab for problem based medical decision  making.   Patient discussed with Dr. Dareen Piano

## 2017-09-28 NOTE — Assessment & Plan Note (Signed)
His blood pressure is well controlled today at 129/76 but likely has a chronic cough associated with the lisinopril.  He does not have diabetes or other known nephropathy so we will try just stopping his ACE inhibitor at this time. Plan: Discontinue lisinopril-hydrochlorothiazide 20-12.5mg  Start hydrochlorthiazide 25 mg daily

## 2017-09-28 NOTE — Patient Instructions (Addendum)
Let us change your blood pressure medicine to stop the lisinopril.  We will see you back to recheck how are doing on this change and if your cough gets better within the next 4 weeks or so.  You have some prostate enlargement which I expect is causing the difficulty emptying your bladder all the way and having to pee at night. I recommend starting tamsulosin (Flomax) 0.4 mg once daily.  We will see if you get an improvement in the urinary symptoms on this medicine.  I am also checking some blood work to make sure there is no trouble with your kidneys due to a bladder obstruction or anything concerning that the prostate enlargement is not just normal age-related change.

## 2017-09-28 NOTE — Assessment & Plan Note (Signed)
He describes characteristic bladder outlet obstruction symptoms and exam indicates an enlarged prostate.  Given age and ethnicity he has risk for prostate cancer so we will also check PSA in addition to starting treatment for presumed BPH. Plan: Start tamsulosin 0.4 mg daily If symptoms are not improving at follow-up consider a more aggressive alpha-blocker or starting finasteride Check PSA If PSA elevated, would refer to urology for further evaluation and management

## 2017-09-29 LAB — BMP8+ANION GAP
Anion Gap: 14 mmol/L (ref 10.0–18.0)
BUN/Creatinine Ratio: 11 (ref 10–24)
BUN: 14 mg/dL (ref 8–27)
CO2: 23 mmol/L (ref 20–29)
Calcium: 8.8 mg/dL (ref 8.6–10.2)
Chloride: 104 mmol/L (ref 96–106)
Creatinine, Ser: 1.27 mg/dL (ref 0.76–1.27)
GFR calc Af Amer: 70 mL/min/{1.73_m2} (ref >59)
GFR calc non Af Amer: 61 mL/min/{1.73_m2} (ref >59)
Glucose: 84 mg/dL (ref 65–99)
Potassium: 4.1 mmol/L (ref 3.5–5.2)
Sodium: 141 mmol/L (ref 134–144)

## 2017-09-29 LAB — PSA: Prostate Specific Ag, Serum: 4.9 ng/mL — ABNORMAL HIGH (ref 0.0–4.0)

## 2017-10-12 ENCOUNTER — Telehealth: Payer: Self-pay | Admitting: Internal Medicine

## 2017-10-12 ENCOUNTER — Encounter: Payer: Self-pay | Admitting: Internal Medicine

## 2017-10-12 DIAGNOSIS — R972 Elevated prostate specific antigen [PSA]: Secondary | ICD-10-CM | POA: Insufficient documentation

## 2017-10-12 DIAGNOSIS — R350 Frequency of micturition: Secondary | ICD-10-CM

## 2017-10-12 NOTE — Telephone Encounter (Signed)
I called Mr. Bentsen to follow up his previous clinic visit with me on 5/24. Since that time he has partial improvement of urinary symptoms with nocturia decreased to 1 time/night on average. He is still coughing on a daily basis.  I discussed the elevated PSA level of 4.9 with recommendation for follow up at Urology. Specifically that I think ultrasound and/or biopsy would be appropriate to decide on management or observation going forwards. He agrees with this plan so I will place a referral today.

## 2017-11-28 ENCOUNTER — Encounter: Payer: Self-pay | Admitting: *Deleted

## 2017-11-30 DIAGNOSIS — R972 Elevated prostate specific antigen [PSA]: Secondary | ICD-10-CM | POA: Diagnosis not present

## 2017-12-13 NOTE — Progress Notes (Signed)
Urinalysis reviewed and within normal limits.

## 2017-12-27 ENCOUNTER — Other Ambulatory Visit: Payer: Self-pay | Admitting: Internal Medicine

## 2017-12-27 DIAGNOSIS — I10 Essential (primary) hypertension: Secondary | ICD-10-CM

## 2017-12-27 DIAGNOSIS — R350 Frequency of micturition: Secondary | ICD-10-CM

## 2018-04-07 ENCOUNTER — Other Ambulatory Visit: Payer: Self-pay | Admitting: Internal Medicine

## 2018-04-07 DIAGNOSIS — R350 Frequency of micturition: Secondary | ICD-10-CM

## 2018-04-12 ENCOUNTER — Other Ambulatory Visit: Payer: Self-pay | Admitting: *Deleted

## 2018-04-12 DIAGNOSIS — I10 Essential (primary) hypertension: Secondary | ICD-10-CM

## 2018-04-12 MED ORDER — HYDROCHLOROTHIAZIDE 25 MG PO TABS: 25.0000 mg | ORAL_TABLET | Freq: Every day | ORAL | 2 refills | Status: DC

## 2018-05-16 ENCOUNTER — Emergency Department (HOSPITAL_COMMUNITY): Payer: Managed Care, Other (non HMO)

## 2018-05-16 ENCOUNTER — Encounter (HOSPITAL_COMMUNITY): Payer: Self-pay

## 2018-05-16 ENCOUNTER — Emergency Department (HOSPITAL_COMMUNITY)
Admission: EM | Admit: 2018-05-16 | Discharge: 2018-05-16 | Disposition: A | Discharge status: Home / Self Care | Payer: Managed Care, Other (non HMO) | Attending: Emergency Medicine | Admitting: Emergency Medicine

## 2018-05-16 ENCOUNTER — Other Ambulatory Visit: Payer: Self-pay

## 2018-05-16 DIAGNOSIS — K5732 Diverticulitis of large intestine without perforation or abscess without bleeding: Secondary | ICD-10-CM | POA: Insufficient documentation

## 2018-05-16 DIAGNOSIS — Z87891 Personal history of nicotine dependence: Secondary | ICD-10-CM | POA: Diagnosis not present

## 2018-05-16 DIAGNOSIS — R1032 Left lower quadrant pain: Secondary | ICD-10-CM | POA: Diagnosis present

## 2018-05-16 DIAGNOSIS — I10 Essential (primary) hypertension: Secondary | ICD-10-CM | POA: Insufficient documentation

## 2018-05-16 DIAGNOSIS — K5792 Diverticulitis of intestine, part unspecified, without perforation or abscess without bleeding: Secondary | ICD-10-CM

## 2018-05-16 DIAGNOSIS — R350 Frequency of micturition: Secondary | ICD-10-CM | POA: Insufficient documentation

## 2018-05-16 LAB — COMPREHENSIVE METABOLIC PANEL
ALT: 24 U/L (ref 0–44)
AST: 26 U/L (ref 15–41)
Albumin: 3.9 g/dL (ref 3.5–5.0)
Alkaline Phosphatase: 50 U/L (ref 38–126)
Anion gap: 10 (ref 5–15)
BUN: 13 mg/dL (ref 8–23)
CO2: 23 mmol/L (ref 22–32)
Calcium: 8.8 mg/dL — ABNORMAL LOW (ref 8.9–10.3)
Chloride: 102 mmol/L (ref 98–111)
Creatinine, Ser: 1.41 mg/dL — ABNORMAL HIGH (ref 0.61–1.24)
GFR calc Af Amer: >60 mL/min (ref >60)
GFR calc non Af Amer: 53 mL/min — ABNORMAL LOW (ref >60)
Glucose, Bld: 168 mg/dL — ABNORMAL HIGH (ref 70–99)
Potassium: 3.3 mmol/L — ABNORMAL LOW (ref 3.5–5.1)
Sodium: 135 mmol/L (ref 135–145)
Total Bilirubin: 0.5 mg/dL (ref 0.3–1.2)
Total Protein: 7.9 g/dL (ref 6.5–8.1)

## 2018-05-16 LAB — CBC
HCT: 42.8 % (ref 39.0–52.0)
Hemoglobin: 13.5 g/dL (ref 13.0–17.0)
MCH: 26.9 pg (ref 26.0–34.0)
MCHC: 31.5 g/dL (ref 30.0–36.0)
MCV: 85.3 fL (ref 80.0–100.0)
Platelets: 271 10*3/uL (ref 150–400)
RBC: 5.02 MIL/uL (ref 4.22–5.81)
RDW: 14.5 % (ref 11.5–15.5)
WBC: 10.5 10*3/uL (ref 4.0–10.5)
nRBC: 0 % (ref 0.0–0.2)

## 2018-05-16 LAB — URINALYSIS, ROUTINE W REFLEX MICROSCOPIC
Bilirubin Urine: NEGATIVE
Glucose, UA: NEGATIVE mg/dL
Hgb urine dipstick: NEGATIVE
Ketones, ur: NEGATIVE mg/dL
Leukocytes, UA: NEGATIVE
Nitrite: NEGATIVE
Protein, ur: NEGATIVE mg/dL
Specific Gravity, Urine: 1.015 (ref 1.005–1.030)
pH: 6 (ref 5.0–8.0)

## 2018-05-16 LAB — LIPASE, BLOOD: Lipase: 31 U/L (ref 11–51)

## 2018-05-16 MED ORDER — METRONIDAZOLE 500 MG PO TABS: 500.0000 mg | ORAL_TABLET | Freq: Two times a day (BID) | ORAL | 0 refills | Status: AC

## 2018-05-16 MED ORDER — LACTATED RINGERS IV BOLUS
1000.0000 mL | Freq: Once | INTRAVENOUS | Status: AC
  Administered 2018-05-16: 1000 mL via INTRAVENOUS

## 2018-05-16 MED ORDER — IOPAMIDOL (ISOVUE-300) INJECTION 61%
100.0000 mL | Freq: Once | INTRAVENOUS | Status: AC | PRN
  Administered 2018-05-16: 100 mL via INTRAVENOUS

## 2018-05-16 MED ORDER — SODIUM CHLORIDE (PF) 0.9 % IJ SOLN
INTRAMUSCULAR | Status: AC
  Filled 2018-05-16: qty 50

## 2018-05-16 MED ORDER — IOPAMIDOL (ISOVUE-300) INJECTION 61%
INTRAVENOUS | Status: AC
  Filled 2018-05-16: qty 100

## 2018-05-16 MED ORDER — CIPROFLOXACIN HCL 500 MG PO TABS: 500.0000 mg | ORAL_TABLET | Freq: Two times a day (BID) | ORAL | 0 refills | Status: AC

## 2018-05-16 NOTE — Discharge Instructions (Addendum)
Follow-up with your primary care doctor to discuss need for MRI to evaluate for pancreatic cyst/mass.

## 2018-05-16 NOTE — ED Notes (Signed)
Patient transported to CT 

## 2018-05-16 NOTE — ED Provider Notes (Signed)
[facility] DEPT Provider Note   CSN: [id number] Arrival date & time: 05/16/18  8295     History   Chief Complaint Chief Complaint  Patient presents with  . Abdominal Pain  . Urinary Frequency  . Constipation    HPI Harold Hayes is a 63 y.o. male.  The history is provided by the patient.  Abdominal Pain  Pain location:  LLQ and suprapubic Pain quality: aching and dull   Pain radiates to:  Does not radiate Pain severity:  Mild Onset quality:  Gradual Timing:  Constant Progression:  Unchanged Chronicity:  New Context: not eating, not laxative use, not previous surgeries, not sick contacts and not suspicious food intake   Relieved by:  Nothing Worsened by:  Nothing Ineffective treatments:  None tried Associated symptoms: constipation   Associated symptoms: no chest pain, no chills, no cough, no dysuria, no fever, no hematuria, no melena, no nausea, no shortness of breath, no sore throat and no vomiting   Risk factors: no alcohol abuse     Past Medical History:  Diagnosis Date  . Hypertension     Patient Active Problem List   Diagnosis Date Noted  . Elevated PSA, less than 10 ng/ml 10/12/2017  . Urinary frequency 09/28/2017  . Chronic cough 09/28/2017  . Numbness of arm 06/20/2016  . Essential hypertension 06/20/2016  . Healthcare maintenance 06/20/2016    History reviewed. No pertinent surgical history.      Home Medications    Prior to Admission medications   Medication Sig Start Date End Date Taking? Authorizing Provider  Aspirin-Caffeine (BAYER BACK & BODY) 500-32.5 MG TABS Take 2 tablets by mouth daily as needed (pain).   Yes [provider]  hydrochlorothiazide (HYDRODIURIL) 25 MG tablet Take 1 tablet (25 mg total) by mouth daily. 04/12/18  Yes Agyei, Caprice Kluver, MD  ibuprofen (ADVIL,MOTRIN) 100 MG tablet Take 200 mg by mouth every 6 (six) hours as needed for fever.   Yes [provider]  tamsulosin  (FLOMAX) 0.4 MG CAPS capsule TAKE 1 CAPSULE BY MOUTH ONCE DAILY Patient taking differently: Take 0.4 mg by mouth daily.  04/09/18  Yes Agyei, Caprice Kluver, MD  ciprofloxacin (CIPRO) 500 MG tablet Take 1 tablet (500 mg total) by mouth every 12 (twelve) hours for 10 days. 05/16/18 05/26/18  Aisea Bouldin, DO  metroNIDAZOLE (FLAGYL) 500 MG tablet Take 1 tablet (500 mg total) by mouth 2 (two) times daily for 10 days. 05/16/18 05/26/18  Lennice Sites, DO    Family History Family History  Problem Relation Age of Onset  . Diabetes Mother   . Diabetes Sister   . Hypertension Daughter   . Diabetes Sister   . Colon cancer Neg Hx   . Esophageal cancer Neg Hx   . Rectal cancer Neg Hx   . Stomach cancer Neg Hx     Social History Social History   Tobacco Use  . Smoking status: Former Smoker    Packs/day: 1.50    Years: 20.00    Pack years: 30.00    Types: Cigarettes    Last attempt to quit: 05/08/1988    Years since quitting: 30.0  . Smokeless tobacco: Never Used  Substance Use Topics  . Alcohol use: No  . 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   Review of Systems Review of Systems  Constitutional: Negative for chills  and fever.  HENT: Negative for ear pain and sore throat.   Eyes: Negative for pain and visual disturbance.  Respiratory: Negative for cough and shortness of breath.   Cardiovascular: Negative for chest pain and palpitations.  Gastrointestinal: Positive for abdominal pain and constipation. Negative for abdominal distention, anal bleeding, blood in stool, melena, nausea, rectal pain and vomiting.  Genitourinary: Positive for frequency and urgency. Negative for decreased urine volume, dysuria and hematuria.  Musculoskeletal: Negative for arthralgias and back pain.  Skin: Negative for color change and rash.  Neurological: Negative for seizures and syncope.  All other systems reviewed and are  negative.    Physical Exam Updated Vital Signs  ED Triage Vitals  Enc Vitals Group     BP 05/16/18 0955 (!) 156/88     Pulse Rate 05/16/18 0955 (!) 101     Resp 05/16/18 0955 18     Temp 05/16/18 0955 97.8 F (36.6 C)     Temp Source 05/16/18 0955 Oral     SpO2 05/16/18 0955 100 %     Weight 05/16/18 0955 260 lb (117.9 kg)     Height 05/16/18 0955 6' (1.829 m)     Head Circumference --      Peak Flow --      Pain Score 05/16/18 1000 9     Pain Loc --      Pain Edu? --      Excl. in [place]? --     Physical Exam Vitals signs and nursing note reviewed.  Constitutional:      Appearance: He is well-developed.  HENT:     Head: Normocephalic and atraumatic.     Mouth/Throat:     Mouth: Mucous membranes are moist.     Pharynx: Oropharynx is clear.  Eyes:     Extraocular Movements: Extraocular movements intact.     Conjunctiva/sclera: Conjunctivae normal.  Neck:     Musculoskeletal: Neck supple.  Cardiovascular:     Rate and Rhythm: Normal rate and regular rhythm.     Heart sounds: Normal heart sounds. No murmur.  Pulmonary:     Effort: Pulmonary effort is normal. No respiratory distress.     Breath sounds: Normal breath sounds.  Abdominal:     Palpations: Abdomen is soft.     Tenderness: There is abdominal tenderness in the suprapubic area and left lower quadrant. There is guarding. There is no right CVA tenderness, left CVA tenderness or rebound. Negative signs include Murphy's sign and Rovsing's sign.  Skin:    General: Skin is warm and dry.     Capillary Refill: Capillary refill takes less than 2 seconds.  Neurological:     General: No focal deficit present.     Mental Status: He is alert.  Psychiatric:        Mood and Affect: Mood normal.      ED Treatments / Results  Labs (all labs ordered are listed, but only abnormal results are displayed) Labs Reviewed  COMPREHENSIVE METABOLIC PANEL - Abnormal; Notable for the following components:      Result Value    Potassium 3.3 (*)    Glucose, Bld 168 (*)    Creatinine, Ser 1.41 (*)    Calcium 8.8 (*)    GFR calc non Af Amer 53 (*)    All other components within normal limits  LIPASE, BLOOD  CBC  URINALYSIS, ROUTINE W REFLEX MICROSCOPIC    EKG None  Radiology Ct Abdomen Pelvis W Contrast  Result Date:  05/16/2018 CLINICAL DATA:  Left-sided abdominal pain. Dysuria. Constipation. EXAM: CT ABDOMEN AND PELVIS WITH CONTRAST TECHNIQUE: Multidetector CT imaging of the abdomen and pelvis was performed using the standard protocol following bolus administration of intravenous contrast. CONTRAST:  174mL ISOVUE-300 IOPAMIDOL (ISOVUE-300) INJECTION 61% COMPARISON:  None. FINDINGS: Lower chest: Clear lung bases. Normal heart size without pericardial or pleural effusion. Hepatobiliary: Minimal exclusion of the hepatic dome. Normal gallbladder, without biliary ductal dilatation. Pancreas: Mild pancreatic atrophy. Macrolobulated cystic structure is favored to be within the uncinate process of the pancreas. Example at 2.4 x 1.6 cm on image 32/2. No peripancreatic inflammation or main duct dilatation. Spleen: Normal in size, without focal abnormality. Adrenals/Urinary Tract: Normal adrenal glands. 3.7 cm lower pole right renal cyst. An upper pole left renal 1.1 cm cyst. No hydronephrosis. Normal urinary bladder. Stomach/Bowel: Normal stomach, without wall thickening. Scattered colonic diverticula. Moderate edema posterior to the distal descending colon, centered around diverticula. Example image 56/2. Normal terminal ileum and appendix.  Normal small bowel. Vascular/Lymphatic: Normal caliber of the aorta and branch vessels. No abdominopelvic adenopathy. Reproductive: Mild to moderate prostatomegaly. Other: No significant free fluid.  No free intraperitoneal air. Musculoskeletal: Mild bilateral hip osteoarthritis. Mild convex left lumbar spine curvature. Lumbosacral spondylosis. IMPRESSION: 1. Non complicated distal descending  diverticulitis. 2. Macrolobulated cystic structure is favored to be within the uncinate process of the pancreas. Most likely a pseudocyst. Indolent cystic neoplasm could look similar. A fluid-filled transverse duodenal diverticulum could have a similar appearance. Per consensus criteria, follow-up with pre and post contrast abdominal MRI at 6 months is recommended. This recommendation follows ACR consensus guidelines: Managing Incidental Findings on Abdominal CT: White Paper of the ACR Incidental Findings Committee. J Am Coll Radiol 2010;7:754-773. 3. Prostatomegaly. Electronically Signed   By: Abigail Miyamoto M.D.   On: 05/16/2018 12:14    Procedures Procedures (including critical care time)  Medications Ordered in ED Medications  iopamidol (ISOVUE-300) 61 % injection (has no administration in time range)  sodium chloride (PF) 0.9 % injection (has no administration in time range)  lactated ringers bolus 1,000 mL (1,000 mLs Intravenous New Bag/Given 05/16/18 1036)  iopamidol (ISOVUE-300) 61 % injection 100 mL (100 mLs Intravenous Contrast Given 05/16/18 1142)     Initial Impression / Assessment and Plan / ED Course  I have reviewed the triage vital signs and the nursing notes.  Pertinent labs & imaging results that were available during my care of the patient were reviewed by me and considered in my medical decision making (see chart for details).     Harold Hayes is a 63 year old male with history of BPH who presents to the ED with left lower quadrant abdominal pain.  Patient with normal vitals.  No fever.  Patient has also had urinary frequency.  Patient with symptoms for the last several days.  No nausea, no vomiting.  No history of abdominal surgeries.  Patient with tenderness mostly in the left lower quadrant, suprapubic area on exam.  No signs of peritonitis.  Lab work showed no significant anemia, electrolyte abnormality, kidney injury.  No leukocytosis.  No urinary tract infection.  CT scan  showed mild diverticulitis.  Will treat with Flagyl and Cipro.  Patient with incidental pancreatic cyst and given recommendation to follow-up with primary care doctor for likely MRI in 6 months.  Overall patient is well-appearing.  No nausea, no vomiting.  Mild diverticulitis.  Given return precautions and discharged from ED in good condition.  This chart was dictated using  voice recognition software.  Despite best efforts to proofread,  errors can occur which can change the documentation meaning.   Final Clinical Impressions(s) / ED Diagnoses   Final diagnoses:  Diverticulitis    ED Discharge Orders         Ordered    metroNIDAZOLE (FLAGYL) 500 MG tablet  2 times daily     05/16/18 1250    ciprofloxacin (CIPRO) 500 MG tablet  Every 12 hours     05/16/18 1250           Lennice Sites, DO 05/16/18 1252

## 2018-05-16 NOTE — ED Triage Notes (Signed)
Patient  C/o left abdominal pain, urinary frequency, and constipation. Last BM 3 days ago.

## 2018-05-23 ENCOUNTER — Ambulatory Visit: Payer: 59 | Admitting: Internal Medicine

## 2018-05-23 ENCOUNTER — Encounter: Payer: Self-pay | Admitting: Internal Medicine

## 2018-05-23 DIAGNOSIS — K5732 Diverticulitis of large intestine without perforation or abscess without bleeding: Secondary | ICD-10-CM | POA: Insufficient documentation

## 2018-05-23 DIAGNOSIS — K862 Cyst of pancreas: Secondary | ICD-10-CM | POA: Diagnosis not present

## 2018-05-23 DIAGNOSIS — K8689 Other specified diseases of pancreas: Secondary | ICD-10-CM | POA: Insufficient documentation

## 2018-05-23 NOTE — Assessment & Plan Note (Signed)
  Diverticulitis: Was seen in the ED on 05/16/2018 for LLQ abd pain, anorexia absent other systemic signs/symptmos of infection after evaluation with CT demonstrating noncomplicated distal descending colon diverticulitis. CBC unremarkable. CMP with mild increase in [place] not meeting AKI status but patient endorses persistent urination without tolerating PO intake. He has since resolved this issue. Hypokalemia likely due to HCTZ use in dehydrated status but given his improved PO intake, I will recommend a BMP at his PCP's next visit.  Plan: Continue Flagyl and ciprofloxacin  Given strict return precautions Advised to decreased red meat intake, increase dietary fiber and increase physical activity with daily 25min walks recommended

## 2018-05-23 NOTE — Patient Instructions (Signed)
FOLLOW-UP INSTRUCTIONS When: 06/28/2018 with your PCP Dr. Eileen Stanford For: Routine visit What to bring: All of your medications  I have not made any changes to your medications today.   Today we discussed your diverticulitis and the cyst like structure on you pancreas. We have ordered an MRI to be completed ~6 weeks from the CT scan on 05/16/2018 for the pancrease structure. With regard to the diverticulitis, please complete the antibiotic course as prescribed and notify us if your pain returns, or you develop a fever, chills or other concern. Please increase the dietary fiber in your diet, increase the amount of walking you do daily, and decrease the amount red meat you eat weekly to better decrease the risk of worsening the diverticuli.   Thank you for your visit to the [facility] today. If you have any questions or concerns please call us at [phone].

## 2018-05-23 NOTE — Progress Notes (Signed)
   CC: ER follow-up for diverticulitis   HPI:Harold Hayes is a 63 y.o. male who presents for evaluation of diverticulitis. Please see individual problem based A/P for details.  Past Medical History:  Diagnosis Date  . Hypertension    Review of Systems:  ROS was negative except as per HPI.  Physical Exam: Vitals:   05/23/18 1324  BP: (!) 144/76  Pulse: 100  Temp: 98 F (36.7 C)  TempSrc: Oral  SpO2: 100%  Weight: 275 lb 11.2 oz (125.1 kg)   General: A/O x4, in no acute distress, afebrile, nondiaphoretic Cardio: RRR, no mrg's Pulmonary: CTA bilaterally ABD: Abd soft, nontender, nondistended, no ecchymosis or rash noted.   Assessment & Plan:   See Encounters Tab for problem based charting.  Patient discussed with Dr. Eppie Gibson

## 2018-05-23 NOTE — Progress Notes (Signed)
Case discussed with Dr. Berline Lopes at time of visit.  We reviewed the resident's history and exam and pertinent patient test results.  I agree with the assessment, diagnosis, and plan of care documented in the resident's note.  I added an overview reminder for the MRI follow-up of the pancreatic mass in late February 2020,

## 2018-05-23 NOTE — Assessment & Plan Note (Signed)
  Pancreatic macrolobulated cyst: Incidental finding on CT abd for LLQ abd pain. Was read as a possible fluid filled transverse duodenal diverticulum could look similar as well as a cystic neoplasm. Recommended MRI at 6 weeks to confirm or differentiate.   Plan:  MRI ordered for 07/05/2018-07/20/2018.

## 2018-06-28 ENCOUNTER — Ambulatory Visit (INDEPENDENT_AMBULATORY_CARE_PROVIDER_SITE_OTHER): Payer: Managed Care, Other (non HMO) | Admitting: Internal Medicine

## 2018-06-28 ENCOUNTER — Encounter: Payer: Self-pay | Admitting: Internal Medicine

## 2018-06-28 ENCOUNTER — Other Ambulatory Visit: Payer: Self-pay

## 2018-06-28 VITALS — BP 129/76 | HR 92 | Temp 97.5°F | Ht 73.0 in | Wt 277.6 lb

## 2018-06-28 DIAGNOSIS — R972 Elevated prostate specific antigen [PSA]: Secondary | ICD-10-CM

## 2018-06-28 DIAGNOSIS — E876 Hypokalemia: Secondary | ICD-10-CM | POA: Diagnosis not present

## 2018-06-28 DIAGNOSIS — N529 Male erectile dysfunction, unspecified: Secondary | ICD-10-CM | POA: Insufficient documentation

## 2018-06-28 DIAGNOSIS — K869 Disease of pancreas, unspecified: Secondary | ICD-10-CM

## 2018-06-28 DIAGNOSIS — I10 Essential (primary) hypertension: Secondary | ICD-10-CM

## 2018-06-28 DIAGNOSIS — R350 Frequency of micturition: Secondary | ICD-10-CM

## 2018-06-28 DIAGNOSIS — Z79899 Other long term (current) drug therapy: Secondary | ICD-10-CM

## 2018-06-28 DIAGNOSIS — K8689 Other specified diseases of pancreas: Secondary | ICD-10-CM

## 2018-06-28 DIAGNOSIS — Z87448 Personal history of other diseases of urinary system: Secondary | ICD-10-CM

## 2018-06-28 MED ORDER — POTASSIUM CHLORIDE ER 10 MEQ PO TBCR: 20.0000 meq | EXTENDED_RELEASE_TABLET | Freq: Every day | ORAL | 0 refills | Status: DC

## 2018-06-28 MED ORDER — SILDENAFIL CITRATE 50 MG PO TABS: 50.0000 mg | ORAL_TABLET | ORAL | 1 refills | Status: DC | PRN

## 2018-06-28 NOTE — Assessment & Plan Note (Signed)
Erectile dysfunction: Mr. Strader reports of an 43-month history of erectile dysfunction.  He reports that he has difficulty initiating and maintaining erection even in the morning when he wakes up.  He denies headaches, vision abnormalities, hirsutism, gynecomastia, nausea, vomiting.   On physical exams, he was a well virilized gentleman with no abnormal hair distribution, no gynecomastia.  Testis were equal in size, nontender to palpation  Given the history of presenting illness and physical exam findings, he is at low risk for an endocrine related cause of erectile dysfunction and with [place] hold off on further testing with testosterone, FSH, LH.  This is most likely due to vascular etiology from age and hypertension.  Plan: - Trial of sildenafil.  Patient advised about the side effects of sildenafil and asked to hold medication if he begins to experience severe dizziness, lightheadedness or presyncope.

## 2018-06-28 NOTE — Progress Notes (Signed)
   CC: Erectile dysfunction  HPI:  [name] is a 63 y.o. African-American gentleman with medical history listed below here concerned about erectile dysfunction.  See problem based charting for further details.   Past Medical History:  Diagnosis Date  . Hypertension   . Numbness of arm 06/20/2016   Review of Systems: As per HPI  Physical Exam:  Vitals:   06/28/18 1533  BP: 129/76  Pulse: 92  Temp: (!) 97.5 F (36.4 C)  TempSrc: Oral  SpO2: 100%  Weight: 277 lb 9.6 oz (125.9 kg)  Height: 6\' 1"  (1.854 m)   Physical Exam Vitals signs and nursing note reviewed.  Constitutional:      General: He is not in acute distress.    Appearance: He is not toxic-appearing.     Comments: No abnormal hair or fat distribution.  HENT:     Head: Normocephalic and atraumatic.  Eyes:     Conjunctiva/sclera: Conjunctivae normal.  Neck:     Musculoskeletal: Neck supple.  Cardiovascular:     Rate and Rhythm: Normal rate and regular rhythm.     Heart sounds: Normal heart sounds. No murmur. No friction rub. No gallop.   Pulmonary:     Effort: Pulmonary effort is normal.     Breath sounds: Normal breath sounds. No wheezing, rhonchi or rales.  Abdominal:     General: Bowel sounds are normal.     Tenderness: There is no abdominal tenderness.  Genitourinary:    Comments: Penis normal Testes normal, symmetric, nontender to palpation Prostate normal on DRE Rectum normal Musculoskeletal:     Comments: No gynecomastia.  Skin:    General: Skin is warm.  Neurological:     Mental Status: He is alert.     Motor: No weakness.  Psychiatric:        Mood and Affect: Mood normal.        Behavior: Behavior normal.     Assessment & Plan:   See Encounters Tab for problem based charting.  Patient discussed with Dr. Evette Doffing

## 2018-06-28 NOTE — Assessment & Plan Note (Signed)
Harold Hayes has been advised follow-up for scheduled MRI late February or early March.

## 2018-06-28 NOTE — Assessment & Plan Note (Signed)
Urinary frequency Suspecting BPH He does report significant improvement with nocturia ever since he was started on tamsulosin.  He denies dysuria, urinary frequency, urinary urgency or straining with urination.  On DRE, prostate could not be palpated.  Plan: -Continue tamsulosin

## 2018-06-28 NOTE — Patient Instructions (Signed)
Mr. Prochnow,  It was a pleasure taking care of you at the clinic today.  Overall you seem to be doing pretty well and I am glad to hear that your diverticulitis has resolved.  Here are my recommendations after today's visit  1.  Continue your blood pressure medication. 2.  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 3.  For your erectile dysfunction, as discussed I will start you on a trial of Viagra.  Also note that a side effect of Viagra can be low blood pressure which can cause dizziness.  If this happens please stop taking the medication give [place] a call. 4.  I am getting a prostate lab on you as well today.  Dr. Eileen Stanford  Please call the internal medicine center clinic if you have any questions or concerns, we may be able to help and keep you from a long and expensive emergency room wait. Our clinic and after hours phone number is [phone], the best time to call is Monday through Friday 9 am to 4 pm but there is always someone available 24/7 if you have an emergency. If you need medication refills please notify your pharmacy one week in advance and they will send [place] a request.

## 2018-06-28 NOTE — Assessment & Plan Note (Signed)
Elevated PSA: Harold Hayes was noted to have mildly elevated PSA of 4.9 on Sep 28, 2017.  He denies any immediate family history of prostate cancer though he states that several of his cousins passed from prostate cancer.  He denies any weight loss, night sweats or fevers.  Plan: - Repeat PSA today.  If elevated, will refer to urology.

## 2018-06-28 NOTE — Assessment & Plan Note (Addendum)
K+ of 3.3 on 05/16/2018  Plan:  -Will prescribe KDur 20 mEq daily for 1 week

## 2018-06-28 NOTE — Assessment & Plan Note (Signed)
Hypertension: BP stable and at goal  BP Readings from Last 3 Encounters:  06/28/18 129/76  05/23/18 (!) 144/76  05/16/18 137/82   Plan: -Continue hydrochlorothiazide 25 mg daily

## 2018-06-29 LAB — PSA: Prostate Specific Ag, Serum: 6.5 ng/mL — ABNORMAL HIGH (ref 0.0–4.0)

## 2018-06-29 LAB — BMP8+ANION GAP
Anion Gap: 15 mmol/L (ref 10.0–18.0)
BUN/Creatinine Ratio: 10 (ref 10–24)
BUN: 13 mg/dL (ref 8–27)
CO2: 24 mmol/L (ref 20–29)
Calcium: 8.9 mg/dL (ref 8.6–10.2)
Chloride: 100 mmol/L (ref 96–106)
Creatinine, Ser: 1.34 mg/dL — ABNORMAL HIGH (ref 0.76–1.27)
GFR calc Af Amer: 65 mL/min/{1.73_m2} (ref >59)
GFR calc non Af Amer: 56 mL/min/{1.73_m2} — ABNORMAL LOW (ref >59)
Glucose: 112 mg/dL — ABNORMAL HIGH (ref 65–99)
Potassium: 4.1 mmol/L (ref 3.5–5.2)
Sodium: 139 mmol/L (ref 134–144)

## 2018-07-01 NOTE — Progress Notes (Signed)
Internal Medicine Clinic Attending  Case discussed with Dr. Agyei at the time of the visit.  We reviewed the resident's history and exam and pertinent patient test results.  I agree with the assessment, diagnosis, and plan of care documented in the resident's note.    

## 2018-07-12 ENCOUNTER — Other Ambulatory Visit: Payer: Self-pay | Admitting: Internal Medicine

## 2018-07-12 DIAGNOSIS — I10 Essential (primary) hypertension: Secondary | ICD-10-CM

## 2018-07-12 DIAGNOSIS — R350 Frequency of micturition: Secondary | ICD-10-CM

## 2018-07-15 ENCOUNTER — Other Ambulatory Visit: Payer: Self-pay | Admitting: Internal Medicine

## 2018-07-15 ENCOUNTER — Telehealth: Payer: Self-pay

## 2018-07-15 ENCOUNTER — Encounter: Payer: Self-pay | Admitting: Internal Medicine

## 2018-07-15 ENCOUNTER — Telehealth: Payer: Self-pay | Admitting: Internal Medicine

## 2018-07-15 DIAGNOSIS — R972 Elevated prostate specific antigen [PSA]: Secondary | ICD-10-CM

## 2018-07-15 NOTE — Telephone Encounter (Signed)
Letter sent.

## 2018-07-15 NOTE — Telephone Encounter (Signed)
Requesting lab results. Please call pt back.  

## 2018-07-16 NOTE — Telephone Encounter (Signed)
Thanks Hme 

## 2018-07-22 NOTE — Telephone Encounter (Signed)
Letter with results sent by PCP on 07/15/2018. Hubbard Hartshorn, RN, BSN

## 2018-08-12 ENCOUNTER — Other Ambulatory Visit: Payer: Self-pay | Admitting: Internal Medicine

## 2018-08-12 DIAGNOSIS — R350 Frequency of micturition: Secondary | ICD-10-CM

## 2018-09-14 ENCOUNTER — Other Ambulatory Visit: Payer: Self-pay | Admitting: Internal Medicine

## 2018-09-14 DIAGNOSIS — R350 Frequency of micturition: Secondary | ICD-10-CM

## 2018-10-14 ENCOUNTER — Ambulatory Visit (HOSPITAL_COMMUNITY): Payer: Managed Care, Other (non HMO)

## 2018-10-16 ENCOUNTER — Other Ambulatory Visit: Payer: Self-pay | Admitting: Internal Medicine

## 2018-10-16 DIAGNOSIS — R350 Frequency of micturition: Secondary | ICD-10-CM

## 2018-10-16 DIAGNOSIS — I10 Essential (primary) hypertension: Secondary | ICD-10-CM

## 2018-10-25 ENCOUNTER — Ambulatory Visit (HOSPITAL_COMMUNITY): Payer: Managed Care, Other (non HMO)

## 2018-11-07 ENCOUNTER — Encounter: Payer: Self-pay | Admitting: *Deleted

## 2018-11-17 ENCOUNTER — Other Ambulatory Visit: Payer: Self-pay | Admitting: Internal Medicine

## 2018-11-17 DIAGNOSIS — I10 Essential (primary) hypertension: Secondary | ICD-10-CM

## 2018-12-11 ENCOUNTER — Encounter: Payer: Self-pay | Admitting: *Deleted

## 2018-12-18 ENCOUNTER — Encounter: Payer: Managed Care, Other (non HMO) | Admitting: Internal Medicine

## 2019-01-03 ENCOUNTER — Other Ambulatory Visit: Payer: Self-pay

## 2019-01-03 DIAGNOSIS — Z20822 Contact with and (suspected) exposure to covid-19: Secondary | ICD-10-CM

## 2019-01-05 LAB — NOVEL CORONAVIRUS, NAA: SARS-CoV-2, NAA: NOT DETECTED

## 2019-01-07 ENCOUNTER — Telehealth: Payer: Self-pay | Admitting: General Practice

## 2019-01-07 ENCOUNTER — Ambulatory Visit: Payer: Self-pay

## 2019-01-07 NOTE — Telephone Encounter (Signed)
Negative COVID results given. Patient results "NOT Detected." Caller expressed understanding. ° °

## 2019-01-07 NOTE — Telephone Encounter (Signed)
Returned call to patient. He states that his wife tested positive to COVID-19 yesterday and today he was informed that his test was negative. He is asking for advice. He was told to inform his work place that he is taking care of his wife with a positive result. He states his wife is under her physicians care for symptoms. Mr Tai was informed of good preventative practices to keep him safe while caring for his wife. He will social distance as much as possible. He will wear a mask when caring for her and wash his hands often. 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 He was informed that his wife should be contacted by the county health department soon. Mr Dunman will contact his PCP for additional advice and will monitor for symptoms. Mr Fitzmorris verbalized understanding of all information.   Reason for Disposition . Health Information question, no triage required and triager able to answer question  Answer Assessment - Initial Assessment Questions 1. REASON FOR CALL or QUESTION: "What is your reason for calling today?" or "How can I best help you?" or "What question do you have that I can help answer?"    My wife tested positive for COVID-19 and I am negative. We tested the same day. What are my next steps.  Protocols used: INFORMATION ONLY CALL-A-AH

## 2019-01-09 ENCOUNTER — Telehealth: Payer: Self-pay | Admitting: Internal Medicine

## 2019-01-09 NOTE — Telephone Encounter (Signed)
Pt stated his employer told him he needed a note from the health dept, he was ask by nurse to call his employer and ask for specifics on info they needed. Pt tested at [facility] but he did not tell them [facility] is his pcp.  He was cautioned to keep hands clean and away from face, stay in and at least 6 ft away from wife as much as possible, if able to sleep in separate beds/ rooms. Drink plenty of fluids, wear mask if he must go out, not to have guests in. Monitor self for symptoms and call for issues. He is agreeable

## 2019-01-09 NOTE — Telephone Encounter (Signed)
Pt's wife was sick for 2 weeks prior to testing positive for COVID-19 on Tuesday September 1.  He tested negative and would like a nurse to call back what he should do.

## 2019-01-10 NOTE — Telephone Encounter (Signed)
Thanks Helen  

## 2019-01-20 ENCOUNTER — Other Ambulatory Visit: Payer: Self-pay

## 2019-01-20 DIAGNOSIS — Z20822 Contact with and (suspected) exposure to covid-19: Secondary | ICD-10-CM

## 2019-01-21 ENCOUNTER — Telehealth: Payer: Self-pay | Admitting: *Deleted

## 2019-01-21 LAB — NOVEL CORONAVIRUS, NAA: SARS-CoV-2, NAA: NOT DETECTED

## 2019-01-21 NOTE — Telephone Encounter (Signed)
Patient returned call.  Notified him that COVID 19 test result from test performed on 01/20/2019 was negative.

## 2019-03-06 ENCOUNTER — Other Ambulatory Visit: Payer: Self-pay | Admitting: Internal Medicine

## 2019-03-06 DIAGNOSIS — R350 Frequency of micturition: Secondary | ICD-10-CM

## 2019-07-01 ENCOUNTER — Ambulatory Visit: Payer: Managed Care, Other (non HMO) | Attending: Internal Medicine

## 2019-07-01 DIAGNOSIS — Z20822 Contact with and (suspected) exposure to covid-19: Secondary | ICD-10-CM

## 2019-07-02 LAB — NOVEL CORONAVIRUS, NAA: SARS-CoV-2, NAA: NOT DETECTED

## 2019-09-23 ENCOUNTER — Ambulatory Visit
Admission: EM | Admit: 2019-09-23 | Discharge: 2019-09-23 | Disposition: A | Discharge status: Home / Self Care | Payer: Managed Care, Other (non HMO) | Attending: Emergency Medicine | Admitting: Emergency Medicine

## 2019-09-23 DIAGNOSIS — L03113 Cellulitis of right upper limb: Secondary | ICD-10-CM | POA: Diagnosis not present

## 2019-09-23 DIAGNOSIS — T63301A Toxic effect of unspecified spider venom, accidental (unintentional), initial encounter: Secondary | ICD-10-CM | POA: Diagnosis not present

## 2019-09-23 DIAGNOSIS — Z23 Encounter for immunization: Secondary | ICD-10-CM

## 2019-09-23 MED ORDER — TETANUS-DIPHTH-ACELL PERTUSSIS 5-2.5-18.5 LF-MCG/0.5 IM SUSP
0.5000 mL | Freq: Once | INTRAMUSCULAR | Status: AC
  Administered 2019-09-23: 0.5 mL via INTRAMUSCULAR

## 2019-09-23 MED ORDER — DOXYCYCLINE HYCLATE 100 MG PO CAPS: 100.0000 mg | ORAL_CAPSULE | Freq: Two times a day (BID) | ORAL | 0 refills | Status: AC

## 2019-09-23 NOTE — Discharge Instructions (Signed)
Keep area(s) clean and dry. Apply hot compress / towel for 5-10 minutes 3-5 times daily. Take antibiotic as prescribed with food - important to complete course. Return for worsening pain, redness, swelling, discharge, fever.  Helpful prevention tips: Keep nails short to avoid secondary skin infections.

## 2019-09-23 NOTE — ED Triage Notes (Signed)
Pt c/o spider bite a week ago to rt elbow area. States now has swelling, hardness and has heat to rt elbow.

## 2019-09-23 NOTE — ED Provider Notes (Signed)
[facility]    CSN: [id number] Arrival date & time: 09/23/19  [id number]      History   Chief Complaint Chief Complaint  Patient presents with  . Insect Bite    HPI Harold Hayes is a 64 y.o. male with history of hypertension presenting for spider bite.  States this happened to his right elbow, 1 week ago.  Has noticed increased swelling, hardness, warmth, tenderness.  Denies painful ROM of elbow.  Does have intermittent paresthesias.  Denies discharge.  States he did try to poke it open with a pin the other day.  No fever, arthralgias, myalgias.   Past Medical History:  Diagnosis Date  . Hypertension   . Numbness of arm 06/20/2016    Patient Active Problem List   Diagnosis Date Noted  . Erectile dysfunction 06/28/2018  . Hypokalemia 06/28/2018  . Diverticulitis of colon 05/23/2018  . Pancreatic mass 05/23/2018  . Elevated PSA, less than 10 ng/ml 10/12/2017  . Urinary frequency 09/28/2017  . Chronic cough 09/28/2017  . Essential hypertension 06/20/2016  . Healthcare maintenance 06/20/2016    History reviewed. No pertinent surgical history.     Home Medications    Prior to Admission medications   Medication Sig Start Date End Date Taking? Authorizing Provider  Aspirin-Caffeine (BAYER BACK & BODY) 500-32.5 MG TABS Take 2 tablets by mouth daily as needed (pain).    [provider]  doxycycline (VIBRAMYCIN) 100 MG capsule Take 1 capsule (100 mg total) by mouth 2 (two) times daily for 7 days. 09/23/19 09/30/19  Hall-Potvin, [place], PA-C  hydrochlorothiazide (HYDRODIURIL) 25 MG tablet Take 1 tablet (25 mg total) by mouth daily. 11/18/18   Jean Rosenthal, MD  ibuprofen (ADVIL,MOTRIN) 100 MG tablet Take 200 mg by mouth every 6 (six) hours as needed for fever.    [provider]  potassium chloride (K-DUR) 10 MEQ tablet Take 2 tablets (20 mEq total) by mouth daily. 06/28/18   Jean Rosenthal, MD  sildenafil (VIAGRA) 50 MG tablet Take 1 tablet (50 mg  total) by mouth as needed for erectile dysfunction. 06/28/18 06/28/19  Jean Rosenthal, MD  tamsulosin (FLOMAX) 0.4 MG CAPS capsule Take 1 capsule by mouth once daily 03/06/19   Jean Rosenthal, MD    Family History Family History  Problem Relation Age of Onset  . Diabetes Mother   . Diabetes Sister   . Hypertension Daughter   . Diabetes Sister   . Colon cancer Neg Hx   . Esophageal cancer Neg Hx   . Rectal cancer Neg Hx   . Stomach cancer Neg Hx     Social History Social History   Tobacco Use  . Smoking status: Former Smoker    Packs/day: 1.50    Years: 20.00    Pack years: 30.00    Types: Cigarettes    Quit date: 05/08/1988    Years since quitting: 31.3  . Smokeless tobacco: Never Used  Substance Use Topics  . Alcohol use: No  . 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   Review of Systems As per HPI   Physical Exam Triage Vital Signs ED Triage Vitals  Enc Vitals Group     BP      Pulse      Resp      Temp      Temp src  SpO2      Weight      Height      Head Circumference      Peak Flow      Pain Score      Pain Loc      Pain Edu?      Excl. in [place]?    No data found.  Updated Vital Signs BP 122/85 (BP Location: Left Arm)   Pulse 86   Temp 98 F (36.7 C) (Oral)   Resp 16   SpO2 98%   Visual Acuity Right Eye Distance:   Left Eye Distance:   Bilateral Distance:    Right Eye Near:   Left Eye Near:    Bilateral Near:     Physical Exam Constitutional:      General: He is not in acute distress. HENT:     Head: Normocephalic and atraumatic.  Eyes:     General: No scleral icterus.    Pupils: Pupils are equal, round, and reactive to light.  Cardiovascular:     Rate and Rhythm: Normal rate.  Pulmonary:     Effort: Pulmonary effort is normal. No respiratory distress.     Breath sounds: No wheezing.  Musculoskeletal:        General: Swelling and tenderness present.  Normal range of motion.     Comments: Right upper extremity with induration, tenderness, warmth distal to elbow.  No elbow joint tenderness, crepitus, or painful ROM.  Skin:    Coloration: Skin is not jaundiced or pale.     Findings: Erythema present.     Comments: >5cm area of erythema, warmth, induration  Neurological:     Mental Status: He is alert and oriented to person, place, and time.      UC Treatments / Results  Labs (all labs ordered are listed, but only abnormal results are displayed) Labs Reviewed - No data to display  EKG   Radiology No results found.  Procedures Procedures (including critical care time)  Medications Ordered in UC Medications  Tdap (BOOSTRIX) injection 0.5 mL (has no administration in time range)    Initial Impression / Assessment and Plan / UC Course  I have reviewed the triage vital signs and the nursing notes.  Pertinent labs & imaging results that were available during my care of the patient were reviewed by me and considered in my medical decision making (see chart for details).     H&P consistent with spider bite with subsequent infection.  Given infectious process with spider bite and poking with pin, updated tetanus (last booster unknown).  Will start doxycycline, do supportive care as outlined below.  Return precautions discussed, patient verbalized understanding and is agreeable to plan. Final Clinical Impressions(s) / UC Diagnoses   Final diagnoses:  Spider bite wound, accidental or unintentional, initial encounter  Cellulitis of right upper extremity     Discharge Instructions     Keep area(s) clean and dry. Apply hot compress / towel for 5-10 minutes 3-5 times daily. Take antibiotic as prescribed with food - important to complete course. Return for worsening pain, redness, swelling, discharge, fever.  Helpful prevention tips: Keep nails short to avoid secondary skin infections.    ED Prescriptions    Medication Sig  Dispense Auth. Provider   doxycycline (VIBRAMYCIN) 100 MG capsule Take 1 capsule (100 mg total) by mouth 2 (two) times daily for 7 days. 14 capsule Hall-Potvin, [place], PA-C     I have reviewed the PDMP during this encounter.  Hall-Potvin, [place], [place] 09/23/19 [phone]

## 2019-10-25 ENCOUNTER — Other Ambulatory Visit: Payer: Self-pay | Admitting: Internal Medicine

## 2019-10-25 DIAGNOSIS — I10 Essential (primary) hypertension: Secondary | ICD-10-CM

## 2019-12-12 ENCOUNTER — Other Ambulatory Visit: Payer: Self-pay

## 2019-12-12 ENCOUNTER — Encounter (HOSPITAL_COMMUNITY): Payer: Self-pay

## 2019-12-12 ENCOUNTER — Emergency Department (HOSPITAL_COMMUNITY): Payer: Managed Care, Other (non HMO)

## 2019-12-12 ENCOUNTER — Emergency Department (HOSPITAL_COMMUNITY)
Admission: EM | Admit: 2019-12-12 | Discharge: 2019-12-13 | Disposition: A | Discharge status: Home / Self Care | Payer: Managed Care, Other (non HMO) | Attending: Emergency Medicine | Admitting: Emergency Medicine

## 2019-12-12 ENCOUNTER — Telehealth: Payer: Self-pay | Admitting: *Deleted

## 2019-12-12 DIAGNOSIS — F17210 Nicotine dependence, cigarettes, uncomplicated: Secondary | ICD-10-CM | POA: Insufficient documentation

## 2019-12-12 DIAGNOSIS — Z79899 Other long term (current) drug therapy: Secondary | ICD-10-CM | POA: Insufficient documentation

## 2019-12-12 DIAGNOSIS — R110 Nausea: Secondary | ICD-10-CM | POA: Insufficient documentation

## 2019-12-12 DIAGNOSIS — Z7982 Long term (current) use of aspirin: Secondary | ICD-10-CM | POA: Insufficient documentation

## 2019-12-12 DIAGNOSIS — R519 Headache, unspecified: Secondary | ICD-10-CM | POA: Diagnosis not present

## 2019-12-12 DIAGNOSIS — R202 Paresthesia of skin: Secondary | ICD-10-CM | POA: Diagnosis not present

## 2019-12-12 DIAGNOSIS — R072 Precordial pain: Secondary | ICD-10-CM | POA: Diagnosis not present

## 2019-12-12 DIAGNOSIS — R0602 Shortness of breath: Secondary | ICD-10-CM | POA: Insufficient documentation

## 2019-12-12 DIAGNOSIS — I10 Essential (primary) hypertension: Secondary | ICD-10-CM | POA: Diagnosis not present

## 2019-12-12 LAB — TROPONIN I (HIGH SENSITIVITY)
Troponin I (High Sensitivity): 5 ng/L (ref <18)
Troponin I (High Sensitivity): 6 ng/L (ref <18)

## 2019-12-12 LAB — BASIC METABOLIC PANEL
Anion gap: 13 (ref 5–15)
BUN: 13 mg/dL (ref 8–23)
CO2: 24 mmol/L (ref 22–32)
Calcium: 8.8 mg/dL — ABNORMAL LOW (ref 8.9–10.3)
Chloride: 102 mmol/L (ref 98–111)
Creatinine, Ser: 1.33 mg/dL — ABNORMAL HIGH (ref 0.61–1.24)
GFR calc Af Amer: >60 mL/min (ref >60)
GFR calc non Af Amer: 56 mL/min — ABNORMAL LOW (ref >60)
Glucose, Bld: 160 mg/dL — ABNORMAL HIGH (ref 70–99)
Potassium: 3.4 mmol/L — ABNORMAL LOW (ref 3.5–5.1)
Sodium: 139 mmol/L (ref 135–145)

## 2019-12-12 LAB — CBC
HCT: 41.7 % (ref 39.0–52.0)
Hemoglobin: 12.9 g/dL — ABNORMAL LOW (ref 13.0–17.0)
MCH: 26.1 pg (ref 26.0–34.0)
MCHC: 30.9 g/dL (ref 30.0–36.0)
MCV: 84.4 fL (ref 80.0–100.0)
Platelets: 295 10*3/uL (ref 150–400)
RBC: 4.94 MIL/uL (ref 4.22–5.81)
RDW: 14.9 % (ref 11.5–15.5)
WBC: 7.3 10*3/uL (ref 4.0–10.5)
nRBC: 0 % (ref 0.0–0.2)

## 2019-12-12 MED ORDER — SODIUM CHLORIDE 0.9% FLUSH: 3.0000 mL | Freq: Once | INTRAVENOUS | Status: DC

## 2019-12-12 NOTE — Telephone Encounter (Signed)
I agree. He needs to be evaluated in ED ASAP

## 2019-12-12 NOTE — Telephone Encounter (Signed)
Call from pt's wife stating pt has been c/o tingling of arms and numbness in his hands x 1 week or so but has had this problems before, years ago. Also states he has problem with his back. States she tried to get pt to go to the hospital but he wants an appt here instead. Pt is currently at work. States she has not notice any facial drooping (states she has a hx of having a stroke) I called pt - denies slurred speech, facial drooping. States last night, he had an episode of tingling in his arms , like hitting your funny bone, which the pain went to his chest. States this happened before while in his car and he had to stopped the car. States he had talked to several people; someone told him to take asa 81 mg and someone else told him he might be having a stroke.  Inform pt he needs to go to the ED for EKG, labs, to be monitored. States he's a pt here and prefers to go here. Inform he needs to be evaluated and monitored closely which can be done in the ED. He finally agreed. Told him I will call his wife; he's agreeable. Call pt's wife back - inform he has agreed to go to the ED. She's relieved and hope he follows thru.

## 2019-12-12 NOTE — Telephone Encounter (Signed)
Per Epic, pt is currently in the ED.

## 2019-12-12 NOTE — ED Triage Notes (Signed)
Pt arrives to ED w/ c/o intermittent BUE numbness and 10/10 generalized chest pain that has been going on for the last 3-4 years. Pt endorses SOB w/ these episodes as well.

## 2019-12-13 ENCOUNTER — Emergency Department (HOSPITAL_COMMUNITY): Payer: Managed Care, Other (non HMO)

## 2019-12-13 MED ORDER — IOHEXOL 350 MG/ML SOLN
100.0000 mL | Freq: Once | INTRAVENOUS | Status: AC | PRN
  Administered 2019-12-13: 100 mL via INTRAVENOUS

## 2019-12-13 NOTE — ED Provider Notes (Signed)
[facility] EMERGENCY DEPARTMENT Provider Note   CSN: [id number] Arrival date & time: 12/12/19  1435     History Chief Complaint  Patient presents with  . Chest Pain    Harold Hayes is a 64 y.o. male.  HPI  HPI: A 64 year old patient with a history of hypertension and obesity presents for evaluation of chest pain. Initial onset of pain was more than 6 hours ago. The patient's chest pain is described as heaviness/pressure/tightness and is worse with exertion. The patient's chest pain is middle- or left-sided, is not well-localized, is not sharp and does radiate to the arms/jaw/neck. The patient does not complain of nausea and denies diaphoresis. The patient has no history of stroke, has no history of peripheral artery disease, has not smoked in the past 90 days, denies any history of treated diabetes, has no relevant family history of coronary artery disease (first degree relative at less than age 22) and has no history of hypercholesterolemia.   Patient reports over 2 days ago he was picking up trash when he had sudden onset of bilateral arm pain and numbness that radiated to his chest.  He reports it was so severe he had to stop and became short of breath.  This lasted approximately 30 minutes and resolved after rest.  He reports he had several episodes that day.  He has also had episodes each day since, but has been several hours since his last episode.  The most recent episodes occurred at rest. He reports he has had this in the past, but not recently.  It is not a chronic problem.  He also reports intermittent numbness in his toes at times.  He denies any fevers or vomiting. He reports a mild headache.  No new visual changes.  No new back or neck pain.  There is no tearing or ripping sensation into his back. No focal weakness.  He does report feeling fatigued and "in a fog "   Past Medical History:  Diagnosis Date  . Hypertension   . Numbness of arm 06/20/2016     Patient Active Problem List   Diagnosis Date Noted  . Erectile dysfunction 06/28/2018  . Hypokalemia 06/28/2018  . Diverticulitis of colon 05/23/2018  . Pancreatic mass 05/23/2018  . Elevated PSA, less than 10 ng/ml 10/12/2017  . Urinary frequency 09/28/2017  . Chronic cough 09/28/2017  . Essential hypertension 06/20/2016  . Healthcare maintenance 06/20/2016    History reviewed. No pertinent surgical history.     Family History  Problem Relation Age of Onset  . Diabetes Mother   . Diabetes Sister   . Hypertension Daughter   . Diabetes Sister   . Colon cancer Neg Hx   . Esophageal cancer Neg Hx   . Rectal cancer Neg Hx   . Stomach cancer Neg Hx     Social History   Tobacco Use  . Smoking status: Former Smoker    Packs/day: 1.50    Years: 20.00    Pack years: 30.00    Types: Cigarettes    Quit date: 05/08/1988    Years since quitting: 31.6  . Smokeless tobacco: Never Used  Vaping Use  . Vaping Use: Never used  Substance Use Topics  . Alcohol use: No  . 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 Authorizing Provider  Aspirin-Caffeine [facility] BACK &  BODY) 500-32.5 MG TABS Take 2 tablets by mouth daily as needed (pain).    [provider]  hydrochlorothiazide (HYDRODIURIL) 25 MG tablet Take 1 tablet by mouth once daily 10/27/19   Jean Rosenthal, MD  ibuprofen (ADVIL,MOTRIN) 100 MG tablet Take 200 mg by mouth every 6 (six) hours as needed for fever.    [provider]  potassium chloride (K-DUR) 10 MEQ tablet Take 2 tablets (20 mEq total) by mouth daily. 06/28/18   Jean Rosenthal, MD  sildenafil (VIAGRA) 50 MG tablet Take 1 tablet (50 mg total) by mouth as needed for erectile dysfunction. 06/28/18 06/28/19  Jean Rosenthal, MD  tamsulosin (FLOMAX) 0.4 MG CAPS capsule Take 1 capsule by mouth once daily 03/06/19   Jean Rosenthal, MD     Allergies    Patient has no known allergies.  Review of Systems   Review of Systems  Constitutional: Negative for fever.  Eyes: Negative for visual disturbance.  Respiratory: Positive for shortness of breath.   Cardiovascular: Positive for chest pain.  Gastrointestinal: Positive for nausea. Negative for vomiting.  Musculoskeletal: Negative for back pain and neck pain.  Neurological: Positive for numbness and headaches. Negative for speech difficulty and weakness.  All other systems reviewed and are negative.   Physical Exam Updated Vital Signs BP (!) 151/97 (BP Location: Right Arm)   Pulse 82   Temp 98.8 F (37.1 C) (Oral)   Resp 19   Ht 1.854 m (6\' 1" )   Wt 125.2 kg   SpO2 100%   BMI 36.41 kg/m   Physical Exam CONSTITUTIONAL: Well developed/well nourished HEAD: Normocephalic/atraumatic EYES: EOMI/PERRL, no nystagmus, no ptosis ENMT: Mucous membranes moist NECK: supple no meningeal signs, no bruits No cervical/thoracic/lumbar tenderness CV: S1/S2 noted, no murmurs/rubs/gallops noted LUNGS: Lungs are clear to auscultation bilaterally, no apparent distress ABDOMEN: soft, nontender, no rebound or guarding GU:no cva tenderness NEURO:Awake/alert, face symmetric, no arm or leg drift is noted Equal 5/5 strength with shoulder abduction, elbow flex/extension, wrist flex/extension in upper extremities and equal hand grips bilaterally Equal 5/5 strength with hip flexion,knee flex/extension, foot dorsi/plantar flexion Cranial nerves 3/4/5/6/11/13/08/11/12 tested and intact Sensation to light touch intact in all extremities EXTREMITIES: pulses normal, full ROM, distal pulses equal x4 SKIN: warm, color normal PSYCH: no abnormalities of mood noted   ED Results / Procedures / Treatments   Labs (all labs ordered are listed, but only abnormal results are displayed) Labs Reviewed  BASIC METABOLIC PANEL - Abnormal; Notable for the following components:      Result Value    Potassium 3.4 (*)    Glucose, Bld 160 (*)    Creatinine, Ser 1.33 (*)    Calcium 8.8 (*)    GFR calc non Af Amer 56 (*)    All other components within normal limits  CBC - Abnormal; Notable for the following components:   Hemoglobin 12.9 (*)    All other components within normal limits  TROPONIN I (HIGH SENSITIVITY)  TROPONIN I (HIGH SENSITIVITY)    EKG EKG Interpretation  Date/Time:  Friday December 12 2019 14:46:20 EDT Ventricular Rate:  96 PR Interval:  148 QRS Duration: 90 QT Interval:  400 QTC Calculation: 505 R Axis:   8 Text Interpretation: Normal sinus rhythm Minimal voltage criteria for LVH, may be normal variant ( R in aVL ) Nonspecific ST abnormality Prolonged QT Abnormal ECG Confirmed by Ripley Fraise [phone]) on 12/13/2019 12:00:34 AM   Radiology DG Chest 2 View  Result Date:  12/12/2019 CLINICAL DATA:  Chest pain. Numbness and weakness extending into the arms and legs. EXAM: CHEST - 2 VIEW COMPARISON:  None. FINDINGS: The cardiomediastinal silhouette is within normal limits. The lungs are mildly hypoinflated. No airspace consolidation, edema, pleural effusion, pneumothorax is identified. There is mild thoracic dextroscoliosis. IMPRESSION: No active cardiopulmonary disease. Electronically Signed   By: Logan Bores M.D.   On: 12/12/2019 15:39   CT Angio Chest/Abd/Pel for Dissection W and/or Wo Contrast  Result Date: 12/13/2019 CLINICAL DATA:  Intermittent bilateral upper extremity numbness and chest pain for the last 3-4 years. Shortness of breath. EXAM: CT ANGIOGRAPHY CHEST, ABDOMEN AND PELVIS TECHNIQUE: Non-contrast CT of the chest was initially obtained. Multidetector CT imaging through the chest, abdomen and pelvis was performed using the standard protocol during bolus administration of intravenous contrast. Multiplanar reconstructed images and MIPs were obtained and reviewed to evaluate the vascular anatomy. CONTRAST:  120mL OMNIPAQUE IOHEXOL 350 MG/ML SOLN COMPARISON:   CT abdomen and pelvis 05/16/2018 FINDINGS: CTA CHEST FINDINGS Cardiovascular: Unenhanced images of the chest demonstrate minimal calcification in the aorta. No intramural hematoma. Images obtained during arterial phase after contrast administration demonstrate normal caliber thoracic aorta. Motion artifact in the area root. No evidence of aortic dissection or aneurysm. Great vessel origins are patent. Heart size is normal. No pericardial effusions. Pulmonary arteries are patent without evidence of significant pulmonary embolus. Mediastinum/Nodes: No enlarged mediastinal, hilar, or axillary lymph nodes. Thyroid gland, trachea, and esophagus demonstrate no significant findings. Lungs/Pleura: Lungs are clear. No pleural effusion or pneumothorax. Musculoskeletal: No chest wall abnormality. No acute or significant osseous findings. Review of the MIP images confirms the above findings. CTA ABDOMEN AND PELVIS FINDINGS VASCULAR Aorta: Normal caliber aorta without aneurysm, dissection, vasculitis or significant stenosis. Celiac: Patent without evidence of aneurysm, dissection, vasculitis or significant stenosis. SMA: Patent without evidence of aneurysm, dissection, vasculitis or significant stenosis. Renals: Both renal arteries are patent without evidence of aneurysm, dissection, vasculitis, fibromuscular dysplasia or significant stenosis. IMA: Patent without evidence of aneurysm, dissection, vasculitis or significant stenosis. Inflow: Patent without evidence of aneurysm, dissection, vasculitis or significant stenosis. Veins: No obvious venous abnormality within the limitations of this arterial phase study. Review of the MIP images confirms the above findings. NON-VASCULAR Hepatobiliary: No focal liver abnormality is seen. No gallstones, gallbladder wall thickening, or biliary dilatation. Pancreas: Unremarkable. No pancreatic ductal dilatation or surrounding inflammatory changes. Spleen: Normal in size without focal  abnormality. Adrenals/Urinary Tract: No adrenal gland nodules. Bilateral renal parenchymal cysts. No change. Nephrograms are symmetrical. No hydronephrosis or hydroureter. Bladder is unremarkable. Stomach/Bowel: Stomach is within normal limits. Appendix appears normal. No evidence of bowel wall thickening, distention, or inflammatory changes. Lymphatic: No significant lymphadenopathy. Reproductive: Prostate gland is enlarged, measuring 6.6 cm diameter. Other: No abdominal wall hernia or abnormality. No abdominopelvic ascites. Musculoskeletal: No acute or significant osseous findings. Review of the MIP images confirms the above findings. IMPRESSION: 1. No evidence of aneurysm or dissection of the thoracic or abdominal aorta. No evidence of significant pulmonary embolus. 2. No evidence of active pulmonary disease. 3. No acute process demonstrated in the abdomen or pelvis. 4. Enlarged prostate gland. Electronically Signed   By: Lucienne Capers M.D.   On: 12/13/2019 03:59    Procedures Procedures  Medications Ordered in ED Medications  sodium chloride flush (NS) 0.9 % injection 3 mL (has no administration in time range)    ED Course  I have reviewed the triage vital signs and the nursing notes.  Pertinent labs &  imaging results that were available during my care of the patient were reviewed by me and considered in my medical decision making (see chart for details).    MDM Rules/Calculators/A&P HEAR Score: 5                          1:36 AM Patient presents with recent episodes of arm pain and numbness and chest pain.  Patient is currently pain-free.  However given his history of hypertension, recurrent episodes of chest pain along with bilateral numbness, will need to be evaluated for aortic dissection. Patient has no focal neuro deficits to suggest acute stroke.  He has no signs to suggest acute spinal emergency.  He denies any active neck or back pain. 4:48 AM Patient stable in the ER.  CT  dissection study was negative for acute process.  He has no further complaints.  No new active chest pain.  Patient has a heart score of 5 and his troponins are negative.  We discussed admission versus discharge and close outpatient follow-up.  Patient understands that his symptoms could return and worsen, but he prefers to be discharged.  We discussed strict return precautions and advised him to call 911 for any return of chest pain or numbness.  I have also placed a call to his internal medicine team to help arrange a close outpatient follow-up.    This patient presents to the  ED for concern of chest pain, this involves an extensive number of treatment options, and is a complaint that carries with it a high risk of complications and morbidity.  The differential diagnosis includes acute coronary syndrome, aortic dissection, pulmonary embolism, pneumonia   Lab Tests:   I Ordered, reviewed, and interpreted labs, which included electrolytes, complete blood count, troponin   Imaging Studies ordered:   I ordered imaging studies which included CT dissection study   I independently visualized and interpreted imaging which showed no acute findings  Additional history obtained:   Previous records obtained and reviewed     Reevaluation:  After the interventions stated above, I reevaluated the patient and found patient is improved   Final Clinical Impression(s) / ED Diagnoses Final diagnoses:  Precordial pain  Paresthesia    Rx / DC Orders ED Discharge Orders    None       Ripley Fraise, MD 12/13/19 0451

## 2019-12-13 NOTE — Discharge Instructions (Addendum)

## 2019-12-15 ENCOUNTER — Encounter: Payer: Self-pay | Admitting: Internal Medicine

## 2019-12-15 ENCOUNTER — Ambulatory Visit (HOSPITAL_COMMUNITY)
Admission: RE | Admit: 2019-12-15 | Discharge: 2019-12-15 | Disposition: A | Discharge status: Home / Self Care | Payer: Managed Care, Other (non HMO) | Source: Ambulatory Visit | Attending: Internal Medicine | Admitting: Internal Medicine

## 2019-12-15 ENCOUNTER — Ambulatory Visit (INDEPENDENT_AMBULATORY_CARE_PROVIDER_SITE_OTHER): Payer: Managed Care, Other (non HMO) | Admitting: Student

## 2019-12-15 ENCOUNTER — Other Ambulatory Visit: Payer: Self-pay

## 2019-12-15 ENCOUNTER — Telehealth: Payer: Self-pay | Admitting: *Deleted

## 2019-12-15 VITALS — BP 145/95 | HR 81 | Temp 97.8°F | Ht 73.0 in | Wt 283.2 lb

## 2019-12-15 DIAGNOSIS — R202 Paresthesia of skin: Secondary | ICD-10-CM | POA: Insufficient documentation

## 2019-12-15 DIAGNOSIS — M79602 Pain in left arm: Secondary | ICD-10-CM | POA: Insufficient documentation

## 2019-12-15 DIAGNOSIS — M79601 Pain in right arm: Secondary | ICD-10-CM

## 2019-12-15 DIAGNOSIS — R200 Anesthesia of skin: Secondary | ICD-10-CM | POA: Diagnosis not present

## 2019-12-15 DIAGNOSIS — G8929 Other chronic pain: Secondary | ICD-10-CM

## 2019-12-15 DIAGNOSIS — M5442 Lumbago with sciatica, left side: Secondary | ICD-10-CM | POA: Diagnosis not present

## 2019-12-15 DIAGNOSIS — M545 Low back pain, unspecified: Secondary | ICD-10-CM | POA: Insufficient documentation

## 2019-12-15 NOTE — Telephone Encounter (Signed)
-----   Message from Jeralyn Bennett, MD sent at 12/13/2019  4:45 AM EDT ----- Regarding: URGENT Appointment Needed Hello,  I received a call from the ED regarding Harold Hayes. He was seen early in the morning 12/13/19 in the ED for CP and arm numbness though full ED workup unremarkable. He was given the option to go home and return to the clinic 8/9 or 8/10.  Could we please get him scheduled for an appointment Monday if possible?  Thank you!  Jeralyn Bennett, PGY1  Pager: [phone]

## 2019-12-15 NOTE — Telephone Encounter (Signed)
[place] office has contacted pt with an appt for today at 3:45

## 2019-12-15 NOTE — Assessment & Plan Note (Signed)
Patient notes episode of numbness/tingling in his arms that radiates into his chest. He does note shortness of breath when pain onset. Denies episodes happening during exertion, but after exertion.   Suspect neurological etiology vs cardiac etiology at this time. Differential includes cervical radiculopathy, spinal stenosis, cardiac ischemia. EKG unchanged from ED visit. Troponins negative in ED. Ordered exercise stress test. Cervical MRI ordered as well.   A/P - Neurological vs cardiac etiology - Stress test ordered - Cervical MRI ordered, negative for signs of saddle anesthesia. Negative S[place].  - Patient given strict return precaution and agrees to plan to return.

## 2019-12-15 NOTE — Progress Notes (Signed)
CC: ER Follow Up visit  HPI:  [name] is a 64 y.o. with a PMHx of HTN and cervical stenosis with bilateral upper extremity paresthesia who was seen in the ED on 12/12/19 for an episode of upper extremity numbness that radiated to his chest and associated shortness of breath. Prior to the event, the patient states he was at work picking up trash. 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 The symptoms lasted approximately 30 minutes and resolved over time, the patient states the following day he had some residual numbness in his fingertips. He notes having episodes of numbness in his arms in the past, however, it has been over a year since he had an episode.   He had another episode of upper extremity numbness/tingling while laying in bed 2 days ago, but did not return to the ED and follow return precautions. When asked why, he notes that he did not want to wait in the ED. The symptoms resolved on their own.   He also notes intermittent numbness in his toes and left thigh. He states he has a history of lower back pain, but feels as though it has worsened over the past week.   He denies any recent illness. He denies any recent trauma. He denies loss of bowel/bladder function, he denies anal or genital numbness or tingling.   He has no other complaints at this time.   Past Medical History:  Diagnosis Date   Hypertension    Numbness of arm 06/20/2016   Review of Systems: Review of Systems  Respiratory: Positive for shortness of breath.   Musculoskeletal: Positive for back pain. Negative for falls.  Neurological:       Numbness/tingling upper extremity/chest  All other systems reviewed and are negative.    Physical Exam:  Vitals:   12/15/19 1528 12/15/19 1539  BP: (!) 154/97 (!) 145/95  Pulse: 84 81  Temp: 97.8 F (36.6 C)   TempSrc: Oral   SpO2: 100%   Weight: 283 lb 3.2 oz (128.5 kg)   Height: 6\' 1"  (1.854  m)    Physical Exam Vitals and nursing note reviewed.  Constitutional:      General: He is not in acute distress.    Appearance: Normal appearance. He is not ill-appearing or toxic-appearing.  Eyes:     Extraocular Movements: Extraocular movements intact.  Cardiovascular:     Rate and Rhythm: Normal rate and regular rhythm.     Pulses: Normal pulses.     Heart sounds: No murmur heard.  No friction rub. No gallop.   Pulmonary:     Effort: Pulmonary effort is normal. No respiratory distress.     Breath sounds: Normal breath sounds. No stridor. No wheezing, rhonchi or rales.  Musculoskeletal:        General: Normal range of motion.     Right lower leg: No edema.     Left lower leg: No edema.  Skin:    General: Skin is warm and dry.  Neurological:     General: No focal deficit present.     Mental Status: He is alert and oriented to person, place, and time.     Sensory: No sensory deficit.     Motor: No weakness.     Gait: Gait abnormal (Patient notes worsening left sided lower back pain when he walks).  Psychiatric:        Mood and Affect: Mood normal.  Behavior: Behavior normal.    EKG Rate 66, Normal Sinus Rhythm. Normal  Axis. LVH present. Non specific ST wave changes.  No acute changes from EKG in ED on 12/12/19.   Assessment & Plan:   See Encounters Tab for problem based charting.  Patient discussed with and seen by Dr. Daryll Drown

## 2019-12-15 NOTE — Patient Instructions (Addendum)
Thank you for allowing [place] to assist in your medical care! It was great to meet you.   Today we discussed:   1) Upper Arm/Chest Numbness - We have put in the orders for you to have an MRI of your cervical spine ( Neck/Upper Back) and exercise stress test of your heart. You will be called to schedule these appointments.  - If you have any new or constant chest pain, worsening episodes of shortness of breath, please go to your closest emergency department or call 911.  - Call and schedule an appointment after you have the above procedures done.   2) Low Back Pain - For any continued low back pain we recommended conservative treatment at this time with Tylenol, Heat, Stretching, and Rest.    If any questions or concerns please do not hesitate to call our office.     Low Back Sprain or Strain Rehab Ask your health care provider which exercises are safe for you. Do exercises exactly as told by your health care provider and adjust them as directed. It is normal to feel mild stretching, pulling, tightness, or discomfort as you do these exercises. Stop right away if you feel sudden pain or your pain gets worse. Do not begin these exercises until told by your health care provider. Stretching and range-of-motion exercises These exercises warm up your muscles and joints and improve the movement and flexibility of your back. These exercises also help to relieve pain, numbness, and tingling. Lumbar rotation  1. Lie on your back on a firm surface and bend your knees. 2. Straighten your arms out to your sides so each arm forms a 90-degree angle (right angle) with a side of your body. 3. Slowly move (rotate) both of your knees to one side of your body until you feel a stretch in your lower back (lumbar). Try not to let your shoulders lift off the floor. 4. Hold this position for __________ seconds. 5. Tense your abdominal muscles and slowly move your knees back to the starting position. 6. Repeat this  exercise on the other side of your body. Repeat __________ times. Complete this exercise __________ times a day. Single knee to chest  1. Lie on your back on a firm surface with both legs straight. 2. Bend one of your knees. Use your hands to move your knee up toward your chest until you feel a gentle stretch in your lower back and buttock. ? Hold your leg in this position by holding on to the front of your knee. ? Keep your other leg as straight as possible. 3. Hold this position for __________ seconds. 4. Slowly return to the starting position. 5. Repeat with your other leg. Repeat __________ times. Complete this exercise __________ times a day. Prone extension on elbows  1. Lie on your abdomen on a firm surface (prone position). 2. Prop yourself up on your elbows. 3. Use your arms to help lift your chest up until you feel a gentle stretch in your abdomen and your lower back. ? This will place some of your body weight on your elbows. If this is uncomfortable, try stacking pillows under your chest. ? Your hips should stay down, against the surface that you are lying on. Keep your hip and back muscles relaxed. 4. Hold this position for __________ seconds. 5. Slowly relax your upper body and return to the starting position. Repeat __________ times. Complete this exercise __________ times a day. Strengthening exercises These exercises build strength and endurance in  your back. Endurance is the ability to use your muscles for a long time, even after they get tired. Pelvic tilt This exercise strengthens the muscles that lie deep in the abdomen. 1. Lie on your back on a firm surface. Bend your knees and keep your feet flat on the floor. 2. Tense your abdominal muscles. Tip your pelvis up toward the ceiling and flatten your lower back into the floor. ? To help with this exercise, you may place a small towel under your lower back and try to push your back into the towel. 3. Hold this position  for __________ seconds. 4. Let your muscles relax completely before you repeat this exercise. Repeat __________ times. Complete this exercise __________ times a day. Alternating arm and leg raises  1. Get on your hands and knees on a firm surface. If you are on a hard floor, you may want to use padding, such as an exercise mat, to cushion your knees. 2. Line up your arms and legs. Your hands should be directly below your shoulders, and your knees should be directly below your hips. 3. Lift your left leg behind you. At the same time, raise your right arm and straighten it in front of you. ? Do not lift your leg higher than your hip. ? Do not lift your arm higher than your shoulder. ? Keep your abdominal and back muscles tight. ? Keep your hips facing the ground. ? Do not arch your back. ? Keep your balance carefully, and do not hold your breath. 4. Hold this position for __________ seconds. 5. Slowly return to the starting position. 6. Repeat with your right leg and your left arm. Repeat __________ times. Complete this exercise __________ times a day. Abdominal set with straight leg raise  1. Lie on your back on a firm surface. 2. Bend one of your knees and keep your other leg straight. 3. Tense your abdominal muscles and lift your straight leg up, 4-6 inches (10-15 cm) off the ground. 4. Keep your abdominal muscles tight and hold this position for __________ seconds. ? Do not hold your breath. ? Do not arch your back. Keep it flat against the ground. 5. Keep your abdominal muscles tense as you slowly lower your leg back to the starting position. 6. Repeat with your other leg. Repeat __________ times. Complete this exercise __________ times a day. Single leg lower with bent knees 1. Lie on your back on a firm surface. 2. Tense your abdominal muscles and lift your feet off the floor, one foot at a time, so your knees and hips are bent in 90-degree angles (right angles). ? Your knees  should be over your hips and your lower legs should be parallel to the floor. 3. Keeping your abdominal muscles tense and your knee bent, slowly lower one of your legs so your toe touches the ground. 4. Lift your leg back up to return to the starting position. ? Do not hold your breath. ? Do not let your back arch. Keep your back flat against the ground. 5. Repeat with your other leg. Repeat __________ times. Complete this exercise __________ times a day. Posture and body mechanics Good posture and healthy body mechanics can help to relieve stress in your body's tissues and joints. Body mechanics refers to the movements and positions of your body while you do your daily activities. Posture is part of body mechanics. Good posture means:  Your spine is in its natural S-curve position (neutral).  Your shoulders  are pulled back slightly.  Your head is not tipped forward. Follow these guidelines to improve your posture and body mechanics in your everyday activities. Standing   When standing, keep your spine neutral and your feet about hip width apart. Keep a slight bend in your knees. Your ears, shoulders, and hips should line up.  When you do a task in which you stand in one place for a long time, place one foot up on a stable object that is 2-4 inches (5-10 cm) high, such as a footstool. This helps keep your spine neutral. Sitting   When sitting, keep your spine neutral and keep your feet flat on the floor. Use a footrest, if necessary, and keep your thighs parallel to the floor. Avoid rounding your shoulders, and avoid tilting your head forward.  When working at a desk or a computer, keep your desk at a height where your hands are slightly lower than your elbows. Slide your chair under your desk so you are close enough to maintain good posture.  When working at a computer, place your monitor at a height where you are looking straight ahead and you do not have to tilt your head forward or  downward to look at the screen. Resting  When lying down and resting, avoid positions that are most painful for you.  If you have pain with activities such as sitting, bending, stooping, or squatting, lie in a position in which your body does not bend very much. For example, avoid curling up on your side with your arms and knees near your chest (fetal position).  If you have pain with activities such as standing for a long time or reaching with your arms, lie with your spine in a neutral position and bend your knees slightly. Try the following positions: ? Lying on your side with a pillow between your knees. ? Lying on your back with a pillow under your knees. Lifting   When lifting objects, keep your feet at least shoulder width apart and tighten your abdominal muscles.  Bend your knees and hips and keep your spine neutral. It is important to lift using the strength of your legs, not your back. Do not lock your knees straight out.  Always ask for help to lift heavy or awkward objects. This information is not intended to replace advice given to you by your health care provider. Make sure you discuss any questions you have with your health care provider. Document Revised: 08/16/2018 Document Reviewed: 05/16/2018 Elsevier Patient Education  [place].

## 2019-12-15 NOTE — Assessment & Plan Note (Signed)
Patient notes worsening of left sided lower back pain for the past week with numbness and tingling in the left leg. Patient notes he has been less active the past week. Suspect the lack of movement has caused worsening of his back pain. Recommended stretches, tylenol for any continued pain, and heat. Recommended activity as tolerable.  Patietn enquired about ibuprofen, however, he had an elevated blood pressure today so recommended tylenol for any continued pain.   A/P -Conservative treatment at this time. Suspect aggravated lower back pain.  -Denies any alarming neurological symptoms of saddle anesthesia, loss of bowel/bladder function.

## 2019-12-16 NOTE — Progress Notes (Signed)
Internal Medicine Clinic Attending  I saw and evaluated the patient.  I personally confirmed the key portions of the history and exam documented by Dr. Katsadouros and I reviewed pertinent patient test results.  The assessment, diagnosis, and plan were formulated together and I agree with the documentation in the resident's note.  

## 2019-12-29 ENCOUNTER — Telehealth: Payer: Self-pay | Admitting: Student

## 2019-12-29 NOTE — Telephone Encounter (Addendum)
Rec'd call from patient requesting a Status of his Imaging:  Number to call for a Peer to Peer [phone]  Please see Denial reason from [place] Order: [id number] Case Status: Denied Initiated Date: 12/22/2019  [id number] MRI Cervical Spine, (spinal canal and contents); without contrast materialDeniedBased on [facility] Spine Imaging Guidelines Section(s): SP 6.1 Lower Extremity Pain with Neurological Features (Radiculopathy, Radiculitis, or Plexopathy and Neuropathy) with or without Low Back (Lumbar Spine) Pain and 1.0 General Guidelines, we cannot approve this request.   Your records show that you are having lower back pain that travels to your hip and/or leg. The reason this request cannot be approved is because: You have not completed six weeks of treatment directed by your doctor.

## 2020-01-01 NOTE — Telephone Encounter (Signed)
Hello, Dr. Johnney Ou will call back patient on Monday. Thank you.

## 2020-01-01 NOTE — Telephone Encounter (Signed)
Pt calling back in reference to his MRI Denial.  Please advise.

## 2020-01-05 ENCOUNTER — Other Ambulatory Visit: Payer: Self-pay | Admitting: Student

## 2020-01-05 NOTE — Telephone Encounter (Signed)
Peer to peer scheduled for this afternoon at 4:45 PM.

## 2020-01-05 NOTE — Telephone Encounter (Signed)
Thank you.  The patient has called again.  Please let me know if you receive an Authorization Number for this patient to be scheduled.

## 2020-01-05 NOTE — Telephone Encounter (Signed)
Authorization has been approved for MRI. Thank you!

## 2020-01-05 NOTE — Telephone Encounter (Signed)
Peer to Peer had with Dr. March Rummage. Approved for MRI. Patient informed and updated of status  Authorization: [id number]

## 2020-01-06 NOTE — Telephone Encounter (Signed)
Thank you for your help!.  I will go ahead and get this patient scheduled as soon as possible.

## 2020-01-16 ENCOUNTER — Other Ambulatory Visit: Payer: Self-pay

## 2020-01-16 ENCOUNTER — Ambulatory Visit (HOSPITAL_COMMUNITY)
Admission: RE | Admit: 2020-01-16 | Discharge: 2020-01-16 | Disposition: A | Discharge status: Home / Self Care | Payer: Managed Care, Other (non HMO) | Source: Ambulatory Visit | Attending: Internal Medicine | Admitting: Internal Medicine

## 2020-01-16 DIAGNOSIS — R202 Paresthesia of skin: Secondary | ICD-10-CM | POA: Insufficient documentation

## 2020-01-16 DIAGNOSIS — M79602 Pain in left arm: Secondary | ICD-10-CM | POA: Insufficient documentation

## 2020-01-16 DIAGNOSIS — M79601 Pain in right arm: Secondary | ICD-10-CM | POA: Diagnosis present

## 2020-01-21 ENCOUNTER — Other Ambulatory Visit: Payer: Self-pay | Admitting: Internal Medicine

## 2020-01-21 DIAGNOSIS — R202 Paresthesia of skin: Secondary | ICD-10-CM

## 2020-01-21 DIAGNOSIS — M79601 Pain in right arm: Secondary | ICD-10-CM

## 2020-01-29 ENCOUNTER — Telehealth: Payer: Self-pay | Admitting: *Deleted

## 2020-01-29 DIAGNOSIS — M79601 Pain in right arm: Secondary | ICD-10-CM

## 2020-01-29 NOTE — Telephone Encounter (Signed)
Aristes placed order for treadmill stress test on 12/15/19 because pt was experiencing paresthesia and pain of both upper extremities. [facility] is unable to arrange for treadmill stress test.  CMA placed referral to cardiology to evaluate and arrange patient for test. Call placed to Cardiology Ivin Booty) to disregard order placed on 08/09 as cardiology referral has been placed.Despina Hidden Cassady9/23/202111:26 AM    Call made to patient to make him aware of appt and CMA was informed that pt had MRI c-spine on 01/16/2020-states pcp contacted him with results and referred him to neurosurgery for  Cervical radiculopathy.  Please advise CMA if pt still needs to have stress test done. Will send request to appropriate team for review. Please advise.Despina Hidden Cassady9/23/202111:44 AM

## 2020-01-29 NOTE — Telephone Encounter (Signed)
Darlene,  Let's continue to plan for the stress test, as patient has hypertension and had the episode of shortness of breath with upper extremity numbness. Thank you!

## 2020-01-30 NOTE — Telephone Encounter (Signed)
Noted..  Referral already in process.  No further action needed, phone call complete.Marland KitchenMarland KitchenDespina Hidden Cassady9/24/20219:23 AM

## 2020-02-11 NOTE — Progress Notes (Signed)
Cardiology Office Note:    Date:  02/13/2020   ID:  Harold Hayes, DOB Aug 11, 1955, MRN [id number]  PCP:  Harold Rack, MD  [facility] Cardiologist:  No primary care provider on file.  [facility] Electrophysiologist:  None   Referring MD: [name], MD    History of Present Illness:    Harold Hayes is a 64 y.o. male with a hx of HTN and obesity who was referred by Dr. Criselda Hayes for evaluation of chest pain.  Patient was in [facility] ED on 12/13/19. Note reviewed. Patient presented with 6 hours of substernal chest pressure/tightness that was worse with exertion. No associated jaw/neck pain, nausea or diaphoresis. He also noted a time when he was taking out the trash when he had sudden onset of bilateral arm pain and numbness that radiated to his chest. This lasted approximately and resolved with sitting and resting. He had several more episodes in the days to follow with one episode occurring at rest. In the ED, ECG with NSR, LVH no ischemic changes. Trop 5-->6. CT chest without evidence of dissection. Patient was discharged from the ED with plans for close follow-up as out-patient.  Patient states that he has been having bilateral arm numbness in the setting of cervical spinal disease. Thought this may be due to compression of the nerve roots and patient is scheduled with NSGY for decompression. Has noted that when his arms go numb, he feels very wiped out and he needs to stop to rest. He has noticed that he can "work like crazy" and then all of a sudden, he feels profoundly fatigued and needs to sit down and rest. States he feels heavy at that time and has chest heaviness but no pressure or pain. No syncope, dizziness, LE edema, orthopnea, PND or palpitations.   Blood pressure elevated 140s today. Does not monitor at home.   Family: Mother with diabetes. Uncle: MI (deceased) in his 80.   Past Medical History:  Diagnosis Date  . Hypertension   . Numbness of arm 06/20/2016      History reviewed. No pertinent surgical history.  Current Medications: Current Meds  Medication Sig  . Aspirin-Caffeine (BAYER BACK & BODY) 500-32.5 MG TABS Take 2 tablets by mouth daily as needed (pain).  . hydrochlorothiazide (HYDRODIURIL) 25 MG tablet Take 1 tablet by mouth once daily  . IBUPROFEN PO Take by mouth as needed.     Allergies:   Lisinopril   Social History   Socioeconomic History  . Marital status: Married    Spouse name: Not on file  . Number of children: Not on file  . Years of education: Not on file  . Highest education level: Not on file  Occupational History  . Not on file  Tobacco Use  . Smoking status: Former Smoker    Packs/day: 1.50    Years: 20.00    Pack years: 30.00    Types: Cigarettes    Quit date: 05/08/1988    Years since quitting: 31.7  . Smokeless tobacco: Never Used  Vaping Use  . Vaping Use: Never used  Substance and Sexual Activity  . Alcohol use: No  . 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 Sexual activity: Yes    Birth control/protection: None  Other Topics Concern  . Not on file  Social History Narrative  . Not on file   Social Determinants of Health   Financial Resource Strain:   .  Difficulty of Paying Living Expenses: Not on file  Food Insecurity:   . Worried About Programme researcher, broadcasting/film/video in the Last Year: Not on file  . Ran Out of Food in the Last Year: Not on file  Transportation Needs:   . Lack of Transportation (Medical): Not on file  . Lack of Transportation (Non-Medical): Not on file  Physical Activity:   . Days of Exercise per Week: Not on file  . Minutes of Exercise per Session: Not on file  Stress:   . Feeling of Stress : Not on file  Social Connections:   . Frequency of Communication with Friends and Family: Not on file  . Frequency of Social Gatherings with Friends and Family: Not on file  . Attends Religious Services: Not on file  . Active Member of Clubs or Organizations:  Not on file  . Attends Banker Meetings: Not on file  . Marital Status: Not on file     Family History: The patient's family history includes Diabetes in his mother, sister, and sister; Hypertension in his daughter. There is no history of Colon cancer, Esophageal cancer, Rectal cancer, or Stomach cancer.  ROS:   Please see the history of present illness.    The patient denies palpitations, PND, orthopnea, or leg swelling. Denies cough, fever, chills. Denies nausea, vomiting. Denies syncope or presyncope. Denies dizziness or lightheadedness.   EKGs/Labs/Other Studies Reviewed:    The following studies were reviewed today: MRI cervical spine 01/16/2020: IMPRESSION: 1. Multilevel cervical spondylosis with resultant mild to moderate spinal stenosis at C4-5 through C6-7. 2. Multifactorial degenerative changes with resultant moderate to severe bilateral C4 through C8 foraminal stenosis as above.  CT Chest 12/13/19: IMPRESSION: 1. No evidence of aneurysm or dissection of the thoracic or abdominal aorta. No evidence of significant pulmonary embolus. 2. No evidence of active pulmonary disease. 3. No acute process demonstrated in the abdomen or pelvis. 4. Enlarged prostate gland.  EKG:  ECG 12/15/19: NSR with LVH; no ischemic changes  Recent Labs: 12/12/2019: BUN 13; Creatinine, Ser 1.33; Hemoglobin 12.9; Platelets 295; Potassium 3.4; Sodium 139  Recent Lipid Panel No results found for: CHOL, TRIG, HDL, CHOLHDL, VLDL, LDLCALC, LDLDIRECT  Physical Exam:    VS:  BP 140/82   Pulse 96   Ht 6\' 1"  (1.854 m)   Wt 284 lb (128.8 kg)   SpO2 96%   BMI 37.47 kg/m     Wt Readings from Last 3 Encounters:  02/13/20 284 lb (128.8 kg)  12/15/19 283 lb 3.2 oz (128.5 kg)  12/12/19 276 lb (125.2 kg)     GEN:  Well nourished, well developed in no acute distress HEENT: Normal NECK: No JVD; No carotid bruits LYMPHATICS: No lymphadenopathy CARDIAC: RRR, no murmurs, rubs,  gallops RESPIRATORY:  Clear to auscultation without rales, wheezing or rhonchi  ABDOMEN: Soft, non-tender, non-distended MUSCULOSKELETAL:  No edema; No deformity  SKIN: Warm and dry NEUROLOGIC:  Alert and oriented x 3 PSYCHIATRIC:  Normal affect   ASSESSMENT:    1. Chest pain, unspecified type   2. Essential hypertension   3. Arm numbness    PLAN:    In order of problems listed above:  #Chest heaviness: #Fatigue: Patient with episodes of exertional fatigue and "chest heaviness" that is relieved with sitting and resting. Often comes on when his arms are becoming numb after working. He is currently being worked up for [employer] radiulopathy and is planned for decompressive surgery in the near future. -Will check nuclear  stress -TTE -Cholesterol panel with LFTs -Will optimize medications pending above findings  #Hypertension: Suspect not well controlled at home. ECG with LVH. SBP 140 today. -Continue HCTZ 25mg  daily -Start amlodipine 5mg  -Check blood pressures at home and keep a log for 5 days and send me the results  #Bilateral arm numbness: #Cervical radiculopathy: Patient with bilateral arm numbness which is likely secondary to cervical radiculopathy. The concerning features are the profound fatigue and chest heaviness that develops with his symptoms. Will do ischemic work-up as detailed above. -Ischemic work-up as above -Follow-up with NSGY as scheduled  Medication Adjustments/Labs and Tests Ordered: Current medicines are reviewed at length with the patient today.  Concerns regarding medicines are outlined above.  Orders Placed This Encounter  Procedures  . Hepatic function panel  . Lipid panel  . MYOCARDIAL PERFUSION IMAGING  . ECHOCARDIOGRAM COMPLETE   Meds ordered this encounter  Medications  . amLODipine (NORVASC) 5 MG tablet    Sig: Take 1 tablet (5 mg total) by mouth daily.    Dispense:  90 tablet    Refill:  1    Patient Instructions  Medication  Instructions:   START TAKING:  AMLODIPINE 5 MG ONCE A DAY   *If you need a refill on your cardiac medications before your next appointment, please call your pharmacy*  Lab Work: RETURN FOR FASTING LIVER AND LIPID   If you have labs (blood work) drawn today and your tests are completely normal, you will receive your results only by: Marland Kitchen MyChart Message (if you have MyChart) OR . A paper copy in the mail If you have any lab test that is abnormal or we need to change your treatment, we will call you to review the results.   Testing/Procedures: Your physician has requested that you have a lexiscan myoview. For further information please visit [email or web address] Please follow instruction sheet, as given.  Your physician has requested that you have an echocardiogram. Echocardiography is a painless test that uses sound waves to create images of your heart. It provides your doctor with information about the size and shape of your heart and how well your heart's chambers and valves are working. This procedure takes approximately one hour. There are no restrictions for this procedure.   Follow-Up: At [facility], you and your health needs are our priority.  As part of our continuing mission to provide you with exceptional heart care, we have created designated Provider Care Teams.  These Care Teams include your primary Cardiologist (physician) and Advanced Practice Providers (APPs -  Physician Assistants and Nurse Practitioners) who all work together to provide you with the care you need, when you need it.  We recommend signing up for the patient portal called "MyChart".  Sign up information is provided on this After Visit Summary.  MyChart is used to connect with patients for Virtual Visits (Telemedicine).  Patients are able to view lab/test results, encounter notes, upcoming appointments, etc.  Non-urgent messages can be sent to your provider as well.   To learn more about what you can do with  MyChart, go to [email or web address].    Your next appointment:   6 month(s)  The format for your next appointment:   In Person  Provider:   Laurance Flatten, MD         Signed, Meriam Sprague, MD  02/13/2020 11:21 AM    [facility]

## 2020-02-13 ENCOUNTER — Encounter: Payer: Self-pay | Admitting: *Deleted

## 2020-02-13 ENCOUNTER — Ambulatory Visit: Payer: Managed Care, Other (non HMO) | Admitting: Cardiology

## 2020-02-13 ENCOUNTER — Other Ambulatory Visit: Payer: Self-pay

## 2020-02-13 ENCOUNTER — Encounter: Payer: Self-pay | Admitting: Cardiology

## 2020-02-13 VITALS — BP 140/82 | HR 96 | Ht 73.0 in | Wt 284.0 lb

## 2020-02-13 DIAGNOSIS — R079 Chest pain, unspecified: Secondary | ICD-10-CM

## 2020-02-13 DIAGNOSIS — R200 Anesthesia of skin: Secondary | ICD-10-CM

## 2020-02-13 DIAGNOSIS — I10 Essential (primary) hypertension: Secondary | ICD-10-CM

## 2020-02-13 MED ORDER — AMLODIPINE BESYLATE 5 MG PO TABS
5.0000 mg | ORAL_TABLET | Freq: Every day | ORAL | 1 refills | Status: DC
Start: 2020-02-13 — End: 2021-01-12

## 2020-02-13 NOTE — Patient Instructions (Signed)
Medication Instructions:   START TAKING:  AMLODIPINE 5 MG ONCE A DAY   *If you need a refill on your cardiac medications before your next appointment, please call your pharmacy*  Lab Work: RETURN FOR FASTING LIVER AND LIPID   If you have labs (blood work) drawn today and your tests are completely normal, you will receive your results only by: Marland Kitchen MyChart Message (if you have MyChart) OR . A paper copy in the mail If you have any lab test that is abnormal or we need to change your treatment, we will call you to review the results.   Testing/Procedures: Your physician has requested that you have a lexiscan myoview. For further information please visit [email or web address]. Please follow instruction sheet, as given.  Your physician has requested that you have an echocardiogram. Echocardiography is a painless test that uses sound waves to create images of your heart. It provides your doctor with information about the size and shape of your heart and how well your heart's chambers and valves are working. This procedure takes approximately one hour. There are no restrictions for this procedure.   Follow-Up: At [facility], you and your health needs are our priority.  As part of our continuing mission to provide you with exceptional heart care, we have created designated Provider Care Teams.  These Care Teams include your primary Cardiologist (physician) and Advanced Practice Providers (APPs -  Physician Assistants and Nurse Practitioners) who all work together to provide you with the care you need, when you need it.  We recommend signing up for the patient portal called "MyChart".  Sign up information is provided on this After Visit Summary.  MyChart is used to connect with patients for Virtual Visits (Telemedicine).  Patients are able to view lab/test results, encounter notes, upcoming appointments, etc.  Non-urgent messages can be sent to your provider as well.   To learn more about what you can  do with MyChart, go to [email or web address].    Your next appointment:   6 month(s)  The format for your next appointment:   In Person  Provider:   Gwyndolyn Kaufman, MD

## 2020-02-20 ENCOUNTER — Telehealth: Payer: Self-pay | Admitting: *Deleted

## 2020-02-20 NOTE — Telephone Encounter (Signed)
Called the requesting office of [facility] and spoke with Dominica. I informed Lorriane Shire that patient is scheduled for an Echocardiogram and 2 day myoview before final cardiac clearance is determined. Lorriane Shire thank me for calling and stated that she will update the patient's record and wait to hear back from our office

## 2020-02-20 NOTE — Telephone Encounter (Signed)
Recently seen by Dr. Johney Frame for chest pain. Awaiting upcoming echocardiogram (10/25) and 2 day myoview (11/1 and 11/4) before final clearance. Please let the requesting provider aware.

## 2020-02-20 NOTE — Telephone Encounter (Signed)
   [facility] Pre-operative Risk Assessment    HEARTCARE STAFF: - Please ensure there is not already an duplicate clearance open for this procedure. - Under Visit Info/Reason for Call, type in Other and utilize the format Clearance MM/DD/YY or Clearance TBD. Do not use dashes or single digits. - If request is for dental extraction, please clarify the # of teeth to be extracted.  Request for surgical clearance:  1. What type of surgery is being performed? C5-6, C6-7 ANTERIOR CERVICAL FUSION   2. When is this surgery scheduled? TBD   3. What type of clearance is required (medical clearance vs. Pharmacy clearance to hold med vs. Both)? MEDICAL  4. Are there any medications that need to be held prior to surgery and how long? NONE LISTED   5. Practice name and name of physician performing surgery? [facility]; DR. Mallie Mussel POOL   6. What is the office phone number? [phone]   7.   What is the office fax number? [place]: VANESSA  8.   Anesthesia type (None, local, MAC, general) ? GENERAL    Julaine Hua 02/20/2020, 8:41 AM  _________________________________________________________________   (provider comments below)

## 2020-03-01 ENCOUNTER — Other Ambulatory Visit: Payer: Self-pay

## 2020-03-01 ENCOUNTER — Telehealth: Payer: Self-pay

## 2020-03-01 ENCOUNTER — Ambulatory Visit (HOSPITAL_COMMUNITY): Payer: Managed Care, Other (non HMO) | Attending: Cardiology

## 2020-03-01 ENCOUNTER — Other Ambulatory Visit: Payer: Managed Care, Other (non HMO)

## 2020-03-01 DIAGNOSIS — R079 Chest pain, unspecified: Secondary | ICD-10-CM | POA: Insufficient documentation

## 2020-03-01 DIAGNOSIS — I10 Essential (primary) hypertension: Secondary | ICD-10-CM

## 2020-03-01 LAB — LIPID PANEL
Chol/HDL Ratio: 5.5 ratio — ABNORMAL HIGH (ref 0.0–5.0)
Cholesterol, Total: 257 mg/dL — ABNORMAL HIGH (ref 100–199)
HDL: 47 mg/dL (ref >39)
LDL Chol Calc (NIH): 192 mg/dL — ABNORMAL HIGH (ref 0–99)
Triglycerides: 101 mg/dL (ref 0–149)
VLDL Cholesterol Cal: 18 mg/dL (ref 5–40)

## 2020-03-01 LAB — HEPATIC FUNCTION PANEL
ALT: 24 IU/L (ref 0–44)
AST: 20 IU/L (ref 0–40)
Albumin: 4.3 g/dL (ref 3.8–4.8)
Alkaline Phosphatase: 75 IU/L (ref 44–121)
Bilirubin Total: 0.3 mg/dL (ref 0.0–1.2)
Bilirubin, Direct: <0.1 mg/dL (ref 0.00–0.40)
Total Protein: 7.5 g/dL (ref 6.0–8.5)

## 2020-03-01 LAB — ECHOCARDIOGRAM COMPLETE
Area-P 1/2: 3.65 cm2
S' Lateral: 2 cm

## 2020-03-01 MED ORDER — PERFLUTREN LIPID MICROSPHERE
1.0000 mL | INTRAVENOUS | Status: AC | PRN
  Administered 2020-03-01: 2 mL via INTRAVENOUS

## 2020-03-01 NOTE — Telephone Encounter (Signed)
-----   Message from Freada Bergeron, MD sent at 03/01/2020 12:45 PM EDT ----- Echo shows normal pumping function of the heart. The heart muscle is thickened which means we need to watch his blood pressure very closely and make sure it is in 120s/80s or below. No significant valvular disease. We will follow-up on his stress test.

## 2020-03-01 NOTE — Telephone Encounter (Signed)
Left patient a message to call office back regarding Echo results.

## 2020-03-02 ENCOUNTER — Other Ambulatory Visit: Payer: Self-pay | Admitting: Cardiology

## 2020-03-02 MED ORDER — ROSUVASTATIN CALCIUM 20 MG PO TABS
20.0000 mg | ORAL_TABLET | Freq: Every day | ORAL | 3 refills | Status: DC
Start: 2020-03-02 — End: 2020-03-04

## 2020-03-03 ENCOUNTER — Telehealth (HOSPITAL_COMMUNITY): Payer: Self-pay | Admitting: *Deleted

## 2020-03-03 NOTE — Telephone Encounter (Signed)
Patient given detailed instructions per Myocardial Perfusion Study Information Sheet for the test on 03/08/20 at 8:15. Patient notified to arrive 15 minutes early and that it is imperative to arrive on time for appointment to keep from having the test rescheduled.  If you need to cancel or reschedule your appointment, please call the office within 24 hours of your appointment. . Patient verbalized understanding.Harold Hayes

## 2020-03-04 ENCOUNTER — Telehealth: Payer: Self-pay | Admitting: *Deleted

## 2020-03-04 DIAGNOSIS — E785 Hyperlipidemia, unspecified: Secondary | ICD-10-CM

## 2020-03-04 MED ORDER — ROSUVASTATIN CALCIUM 20 MG PO TABS
20.0000 mg | ORAL_TABLET | Freq: Every day | ORAL | 3 refills | Status: DC
Start: 2020-03-04 — End: 2021-01-12

## 2020-03-04 NOTE — Telephone Encounter (Signed)
-----   Message from Freada Bergeron, MD sent at 03/02/2020  9:13 AM EDT ----- Cholesterol is elevated with LDL 192. TC is 257. We will start crestor 20mg  and repeat labs in 6 weeks.   I will send to his listed pharmacy (hopefully..Not great with Epic but I will give it a shot).  Thank you!!

## 2020-03-04 NOTE — Telephone Encounter (Signed)
Patient aware.  Lab appointment scheduled. Will pick up Crestor today.

## 2020-03-08 ENCOUNTER — Ambulatory Visit (HOSPITAL_COMMUNITY): Payer: Managed Care, Other (non HMO) | Attending: Cardiology

## 2020-03-08 ENCOUNTER — Other Ambulatory Visit: Payer: Self-pay

## 2020-03-08 DIAGNOSIS — R079 Chest pain, unspecified: Secondary | ICD-10-CM | POA: Insufficient documentation

## 2020-03-08 LAB — MYOCARDIAL PERFUSION IMAGING
LV dias vol: 87 mL (ref 62–150)
LV sys vol: 28 mL
Peak HR: 95 {beats}/min
Rest HR: 74 {beats}/min
SDS: 0
SRS: 0
SSS: 0
TID: 1.05

## 2020-03-08 MED ORDER — TECHNETIUM TC 99M TETROFOSMIN IV KIT
10.4000 | PACK | Freq: Once | INTRAVENOUS | Status: AC | PRN
  Administered 2020-03-08: 10.4 via INTRAVENOUS
  Filled 2020-03-08: qty 11

## 2020-03-08 MED ORDER — TECHNETIUM TC 99M TETROFOSMIN IV KIT
30.8000 | PACK | Freq: Once | INTRAVENOUS | Status: AC | PRN
  Administered 2020-03-08: 30.8 via INTRAVENOUS
  Filled 2020-03-08: qty 31

## 2020-03-08 MED ORDER — REGADENOSON 0.4 MG/5ML IV SOLN
0.4000 mg | Freq: Once | INTRAVENOUS | Status: AC
  Administered 2020-03-08: 0.4 mg via INTRAVENOUS

## 2020-03-09 ENCOUNTER — Ambulatory Visit: Payer: Managed Care, Other (non HMO)

## 2020-03-09 NOTE — Telephone Encounter (Signed)
   Primary Cardiologist: Freada Bergeron, MD  Chart reviewed as part of pre-operative protocol coverage. Given past medical history and time since last visit, based on ACC/AHA guidelines, Harold Hayes would be at acceptable risk for the planned procedure without further cardiovascular testing.   Recent echocardiogram and nuclear stress test were normal. Per Dr. Johney Frame, patient is cleared to proceed with surgery.   The patient was advised that if he develops new symptoms prior to surgery to contact our office to arrange for a follow-up visit, and he verbalized understanding.  I will route this recommendation to the requesting party via Epic fax function and remove from pre-op pool.  Please call with questions.  [place], [place] 03/09/2020, 8:41 AM

## 2020-03-11 ENCOUNTER — Ambulatory Visit (HOSPITAL_COMMUNITY): Payer: Managed Care, Other (non HMO)

## 2020-04-16 ENCOUNTER — Other Ambulatory Visit: Payer: Self-pay

## 2020-04-16 ENCOUNTER — Other Ambulatory Visit: Payer: Managed Care, Other (non HMO) | Admitting: *Deleted

## 2020-04-16 DIAGNOSIS — E785 Hyperlipidemia, unspecified: Secondary | ICD-10-CM

## 2020-04-16 LAB — LIPID PANEL
Chol/HDL Ratio: 3.4 ratio (ref 0.0–5.0)
Cholesterol, Total: 149 mg/dL (ref 100–199)
HDL: 44 mg/dL (ref >39)
LDL Chol Calc (NIH): 89 mg/dL (ref 0–99)
Triglycerides: 86 mg/dL (ref 0–149)
VLDL Cholesterol Cal: 16 mg/dL (ref 5–40)

## 2020-04-16 LAB — HEPATIC FUNCTION PANEL
ALT: 44 IU/L (ref 0–44)
AST: 27 IU/L (ref 0–40)
Albumin: 4.4 g/dL (ref 3.8–4.8)
Alkaline Phosphatase: 81 IU/L (ref 44–121)
Bilirubin Total: 0.3 mg/dL (ref 0.0–1.2)
Bilirubin, Direct: 0.11 mg/dL (ref 0.00–0.40)
Total Protein: 7.5 g/dL (ref 6.0–8.5)

## 2020-04-21 ENCOUNTER — Telehealth: Payer: Self-pay

## 2020-04-21 NOTE — Telephone Encounter (Signed)
Left a message to call the office regarding recent lab results.

## 2020-04-21 NOTE — Telephone Encounter (Signed)
-----   Message from Freada Bergeron, MD sent at 04/17/2020  4:06 PM EST ----- His cholesterol levels look fantastic!! This is great news. His total cholesterol dropped from 257 to 149 and his bad cholesterol (LDL) dropped from 192 to 89. This significant reduces his CV risks and the statin is working well for him.His liver function also looks great.

## 2020-04-24 ENCOUNTER — Other Ambulatory Visit: Payer: Self-pay | Admitting: Internal Medicine

## 2020-04-24 DIAGNOSIS — I10 Essential (primary) hypertension: Secondary | ICD-10-CM

## 2020-04-27 ENCOUNTER — Other Ambulatory Visit: Payer: Self-pay | Admitting: Internal Medicine

## 2020-04-27 DIAGNOSIS — I10 Essential (primary) hypertension: Secondary | ICD-10-CM

## 2020-09-29 ENCOUNTER — Encounter: Payer: Self-pay | Admitting: *Deleted

## 2020-10-27 ENCOUNTER — Telehealth: Payer: Self-pay

## 2020-10-27 ENCOUNTER — Encounter: Payer: Self-pay | Admitting: Internal Medicine

## 2020-10-27 ENCOUNTER — Ambulatory Visit (INDEPENDENT_AMBULATORY_CARE_PROVIDER_SITE_OTHER): Payer: Self-pay | Admitting: Internal Medicine

## 2020-10-27 ENCOUNTER — Other Ambulatory Visit: Payer: Self-pay

## 2020-10-27 DIAGNOSIS — I10 Essential (primary) hypertension: Secondary | ICD-10-CM

## 2020-10-27 DIAGNOSIS — K1379 Other lesions of oral mucosa: Secondary | ICD-10-CM | POA: Insufficient documentation

## 2020-10-27 MED ORDER — HYDROCHLOROTHIAZIDE 25 MG PO TABS: 1.0000 | ORAL_TABLET | Freq: Every day | ORAL | 0 refills | Status: DC

## 2020-10-27 NOTE — Telephone Encounter (Signed)
ERROR

## 2020-10-27 NOTE — Progress Notes (Signed)
   CC: mouth bleeding  HPI:  Harold Hayes is a 65 y.o. with a past medical history listed below presenting for evaluation of mouth bleeding. For details of today's visit and the status of his chronic medical issues please refer to the assessment and plan.   Past Medical History:  Diagnosis Date   Hypertension    Numbness of arm 06/20/2016   Review of Systems:   Review of Systems  Constitutional:  Negative for chills, fever and malaise/fatigue.  HENT:  Negative for nosebleeds.   Respiratory:  Negative for shortness of breath.   Gastrointestinal:  Negative for blood in stool and melena.  Genitourinary:  Negative for hematuria.  Neurological:  Negative for dizziness and weakness.    Physical Exam:  Vitals:   10/27/20 1045 10/27/20 1056  BP: (!) 147/88 120/88  Pulse: 88 76  Temp: (!) 82.4 F (28 C)   TempSrc: Oral   SpO2: 100%   Weight: 271 lb 1.6 oz (123 kg)   Height: 6\' 1"  (1.854 m)     Physical Exam General: alert, appears stated age, in no acute distress HEENT: Normocephalic, atraumatic, EOM intact, conjunctiva normal, no obvious oral lesions, dental decay notable in the left upper posterior most region.  Gums look irritated but no obvious lesion or bleeding, erythema of the back of the throat CV: Regular rate and rhythm, no murmurs rubs or gallops Pulm: Clear to auscultation bilaterally, normal work of breathing Abdomen: Soft, nondistended, bowel sounds present, no tenderness to palpation MSK: No lower extremity edema Skin: Warm and dry Neuro: Alert and oriented x3   Assessment & Plan:   See Encounters Tab for problem based charting.  Patient seen with Dr.  Jimmye Norman

## 2020-10-27 NOTE — Patient Instructions (Signed)
It was a pleasure meeting you today Harold Hayes.  I believe that the mouth bleeding you are experiencing is either from a tooth or your gums.  Once you are financial assistance paperwork is sent and I will refer you to a dentist.  In the meantime if you notice worsening in your bleeding, nosebleeds, lightheadedness or dizziness I want you to give [place] a call.

## 2020-10-27 NOTE — Assessment & Plan Note (Addendum)
Patient presents today for evaluation of mouth bleeding.  He states he woke up with blood on his pillow every morning between last Thursday to this Monday.  He has not noticed any bleeding yesterday or today.  He denies any bleeding throughout the day.  Mentions a small amount of gum bleeding when he brushes his teeth.  States when he wakes up he has found this dried blood on his pillowcase as well as small blood clots in his mouth.  Denies coughing up any blood.  No nausea, vomiting, abdominal pain, shortness of breath, lightheadedness or dizziness, blood in his stool or urine.  He is not on any blood thinners.  On exam the back of his throat is erythematous.  No obvious oral lesions.  There is dental decay in the left upper posterior most tooth.  Gums also seem inflamed/irritable but no lesion.  Plan: Discussed with patient that source of his oral bleeding is likely his gums or tooth.  He is currently uninsured but will need a dental referral once his orange card paperwork goes through.  In the meantime discussed if he has worsening of his bleeding, nosebleeds, lightheadedness or dizziness to return to clinic for evaluation.

## 2020-11-09 ENCOUNTER — Encounter: Payer: Self-pay | Admitting: *Deleted

## 2021-01-12 ENCOUNTER — Other Ambulatory Visit (HOSPITAL_COMMUNITY): Payer: Self-pay

## 2021-01-12 ENCOUNTER — Other Ambulatory Visit: Payer: Self-pay

## 2021-01-12 ENCOUNTER — Encounter: Payer: Self-pay | Admitting: Internal Medicine

## 2021-01-12 ENCOUNTER — Ambulatory Visit: Payer: Self-pay | Admitting: Internal Medicine

## 2021-01-12 DIAGNOSIS — M5442 Lumbago with sciatica, left side: Secondary | ICD-10-CM

## 2021-01-12 DIAGNOSIS — I10 Essential (primary) hypertension: Secondary | ICD-10-CM

## 2021-01-12 DIAGNOSIS — G8929 Other chronic pain: Secondary | ICD-10-CM

## 2021-01-12 MED ORDER — HYDROCHLOROTHIAZIDE 25 MG PO TABS
25.0000 mg | ORAL_TABLET | Freq: Every day | ORAL | 11 refills | Status: DC
  Filled 2021-01-12: qty 30, 30d supply, fill #0
  Filled 2021-04-18: qty 30, 30d supply, fill #1
  Filled 2021-05-19: qty 30, 30d supply, fill #2
  Filled 2021-06-20: qty 30, 30d supply, fill #3
  Filled 2021-07-20: qty 30, 30d supply, fill #4
  Filled 2021-08-18: qty 30, 30d supply, fill #5
  Filled 2021-09-20: qty 30, 30d supply, fill #6
  Filled 2021-10-26: qty 30, 30d supply, fill #7
  Filled 2021-11-24: qty 30, 30d supply, fill #8
  Filled 2021-12-29: qty 30, 30d supply, fill #9

## 2021-01-12 MED ORDER — METHOCARBAMOL 750 MG PO TABS
750.0000 mg | ORAL_TABLET | Freq: Three times a day (TID) | ORAL | 1 refills | Status: DC | PRN
  Filled 2021-01-12: qty 30, 5d supply, fill #0
  Filled 2021-02-10: qty 30, 5d supply, fill #1

## 2021-01-12 MED ORDER — AMLODIPINE BESYLATE 5 MG PO TABS
5.0000 mg | ORAL_TABLET | Freq: Every day | ORAL | 11 refills | Status: DC
  Filled 2021-01-12: qty 30, 30d supply, fill #0
  Filled 2021-02-11: qty 30, 30d supply, fill #1
  Filled 2021-03-16: qty 30, 30d supply, fill #2
  Filled 2021-04-18: qty 30, 30d supply, fill #3
  Filled 2021-05-19: qty 30, 30d supply, fill #4
  Filled 2021-06-20: qty 30, 30d supply, fill #5
  Filled 2021-07-20: qty 30, 30d supply, fill #6
  Filled 2021-08-18: qty 30, 30d supply, fill #7
  Filled 2021-09-20: qty 30, 30d supply, fill #8
  Filled 2021-10-26: qty 30, 30d supply, fill #9
  Filled 2021-11-24: qty 30, 30d supply, fill #10
  Filled 2021-12-29: qty 30, 30d supply, fill #11

## 2021-01-12 MED ORDER — ROSUVASTATIN CALCIUM 20 MG PO TABS
20.0000 mg | ORAL_TABLET | Freq: Every day | ORAL | 11 refills | Status: DC
  Filled 2021-01-12: qty 30, 30d supply, fill #0
  Filled 2021-02-11: qty 30, 30d supply, fill #1
  Filled 2021-03-16: qty 30, 30d supply, fill #2
  Filled 2021-04-18: qty 30, 30d supply, fill #3
  Filled 2021-05-19: qty 30, 30d supply, fill #4
  Filled 2021-06-20: qty 30, 30d supply, fill #5
  Filled 2021-07-20: qty 30, 30d supply, fill #6
  Filled 2021-08-18: qty 30, 30d supply, fill #7
  Filled 2021-09-20: qty 30, 30d supply, fill #8
  Filled 2021-10-26: qty 30, 30d supply, fill #9
  Filled 2021-11-24: qty 30, 30d supply, fill #10
  Filled 2021-12-29: qty 30, 30d supply, fill #11

## 2021-01-12 NOTE — Assessment & Plan Note (Signed)
Blood pressure elevated at 173/100 today on intake.  He patient reporting that he took his blood pressure medication roughly an hour before his visit, advised that it likely has not yet taken in fact.  On upon recheck blood pressure had begun to improve Q000111Q systolic.  Pressure also likely elevated in the setting of uncontrolled pain.  Blood pressure likely elevated in setting of uncontrolled pain.  Advised patient to take his medication on a more consistent schedule.  We will continue the amlodipine 5 mg and hydrochlorothiazide 25 mg for now.

## 2021-01-12 NOTE — Assessment & Plan Note (Signed)
Patient has been having 2 months of left-sided low back pain that radiates down his leg with associated muscle spasms and leg weakness secondary to pain.  He reports he is following secondary to his pain as well. He does have a history of anterior cervical decompression and spinal fusion surgery in November 2021 with Dr. Trenton Gammon.  He was last seen by Dr. Trenton Gammon in February 2022.  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    Reflexes decreased bilateral lower extremities straight leg raise positive at 30 degrees on the left negative on the right.  Decreased strength in his intrinsic hand muscles on the right with decreased leg strength on the right.  No saddle anesthesia no bowel or bladder incontinence.  Vies patient that his weakness is likely chronic due to his condition.  Advised ibuprofen for pain control and to avoid NSAIDs due to his history of hypertension.  Prescription for Robaxin given.  Advised heat and ice as well as continued stretching.

## 2021-01-12 NOTE — Progress Notes (Signed)
CC: Leg and back pain  HPI:Harold Hayes is a 65 y.o. male who presents for evaluation of back and leg pain. Please see individual problem based A/P for details.  Please see encounters tab for problem-based charting.  Problem List Items Addressed This Visit       Cardiovascular and Mediastinum   Essential hypertension (Chronic)    Blood pressure elevated at 173/100 today on intake.  He patient reporting that he took his blood pressure medication roughly an hour before his visit, advised that it likely has not yet taken in fact.  On upon recheck blood pressure had begun to improve Q000111Q systolic.  Pressure also likely elevated in the setting of uncontrolled pain.  Blood pressure likely elevated in setting of uncontrolled pain.  Advised patient to take his medication on a more consistent schedule.  We will continue the amlodipine 5 mg and hydrochlorothiazide 25 mg for now.      Relevant Medications   amLODipine (NORVASC) 5 MG tablet   hydrochlorothiazide (HYDRODIURIL) 25 MG tablet   rosuvastatin (CRESTOR) 20 MG tablet     Other   Low back pain    Patient has been having 2 months of left-sided low back pain that radiates down his leg with associated muscle spasms and leg weakness secondary to pain.  He reports he is following secondary to his pain as well. He does have a history of anterior cervical decompression and spinal fusion surgery in November 2021 with Dr. Trenton Gammon.  He was last seen by Dr. Trenton Gammon in February 2022.  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    Reflexes decreased bilateral lower extremities straight leg raise positive at 30 degrees on the left negative on the right.  Decreased strength in his intrinsic hand muscles on the right with decreased leg strength on the right.  No saddle anesthesia no bowel or bladder incontinence.  Vies patient that his weakness is likely chronic due  to his condition.  Advised ibuprofen for pain control and to avoid NSAIDs due to his history of hypertension.  Prescription for Robaxin given.  Advised heat and ice as well as continued stretching.      Relevant Medications   methocarbamol (ROBAXIN-750) 750 MG tablet     Depression, PHQ-9: Based on the patients  [facility] Visit from 01/12/2021 in [place]  PHQ-9 Total Score 7      score we have .  Past Medical History:  Diagnosis Date   Hypertension    Numbness of arm 06/20/2016   Review of Systems:   Review of Systems  Constitutional: Negative.   HENT: Negative.    Eyes: Negative.   Respiratory: Negative.    Cardiovascular: Negative.   Genitourinary: Negative.   Musculoskeletal:  Positive for back pain and falls.  Skin: Negative.   Neurological:  Positive for focal weakness and weakness.  Psychiatric/Behavioral: Negative.      Physical Exam: Vitals:   01/12/21 0917 01/12/21 1016  BP: (!) 173/100 (!) 156/95  Pulse: 93 87  Temp: 97.7 F (36.5 C)   TempSrc: Oral   SpO2: 99%   Weight: 267 lb 11.2 oz (121.4 kg)   Height: 6' (1.829 m)      General: Alert and oriented no acute distress HEENT: Conjunctiva nl , antiicteric sclerae, moist mucous membranes, no exudate or erythema Cardiovascular: Normal rate, regular rhythm.  No  murmurs, rubs, or gallops Pulmonary : Equal breath sounds, No wheezes, rales, or rhonchi Abdominal: soft, nontender,  bowel sounds present Ext: No edema in lower extremities, no tenderness to palpation of lower extremities.  Neuro: Decreased reflexes in lower extremities bilaterally.  Negative straight leg raise on the right positive straight leg raise at 30 degrees on the left.  Decreased strength in the right lower extremity.  Decreased strength in the intrinsic hand muscles more prominent on the right.  Assessment & Plan:   See Encounters Tab for problem based charting.  Patient seen with Dr. Angelia Mould

## 2021-01-12 NOTE — Patient Instructions (Addendum)
Dear Harold Hayes,  Today we evaluated you for low back pain. We recommend you take a muscle relaxer, Robaxin up to 3 times a day for 5 days at a time. Heating pads for approximately 15 minutes at a time can also help relax your muscles and improve the pain. We also recommend Aleve or tylenol to help with pain.  Stretching and exercising can help improve your back. We recommend you try this in addition to the above treatment methods. Lastly, we do recommend some weight loss to help improve pain in the long term.  If your muscle weakness worsens, or if you experience new bowel or bladder incontinence, please return to the clinic.   We have also sent over the medications to the most common pharmacy.

## 2021-01-17 NOTE — Progress Notes (Signed)
Internal Medicine Clinic Attending  I saw and evaluated the patient.  I personally confirmed the key portions of the history and exam documented by Dr. Gawaluck and I reviewed pertinent patient test results.  The assessment, diagnosis, and plan were formulated together and I agree with the documentation in the resident's note.  

## 2021-02-11 ENCOUNTER — Other Ambulatory Visit (HOSPITAL_COMMUNITY): Payer: Self-pay

## 2021-03-16 ENCOUNTER — Other Ambulatory Visit (HOSPITAL_COMMUNITY): Payer: Self-pay

## 2021-04-18 ENCOUNTER — Other Ambulatory Visit (HOSPITAL_COMMUNITY): Payer: Self-pay

## 2021-05-19 ENCOUNTER — Other Ambulatory Visit (HOSPITAL_COMMUNITY): Payer: Self-pay

## 2021-06-20 ENCOUNTER — Other Ambulatory Visit (HOSPITAL_COMMUNITY): Payer: Self-pay

## 2021-07-20 ENCOUNTER — Other Ambulatory Visit (HOSPITAL_COMMUNITY): Payer: Self-pay

## 2021-08-18 ENCOUNTER — Other Ambulatory Visit (HOSPITAL_COMMUNITY): Payer: Self-pay

## 2021-09-20 ENCOUNTER — Other Ambulatory Visit (HOSPITAL_COMMUNITY): Payer: Self-pay

## 2021-10-26 ENCOUNTER — Other Ambulatory Visit (HOSPITAL_COMMUNITY): Payer: Self-pay

## 2021-10-29 ENCOUNTER — Encounter: Payer: Self-pay | Admitting: *Deleted

## 2021-11-24 ENCOUNTER — Other Ambulatory Visit (HOSPITAL_COMMUNITY): Payer: Self-pay

## 2021-12-29 ENCOUNTER — Other Ambulatory Visit (HOSPITAL_COMMUNITY): Payer: Self-pay

## 2022-02-01 ENCOUNTER — Other Ambulatory Visit: Payer: Self-pay | Admitting: Internal Medicine

## 2022-02-02 ENCOUNTER — Other Ambulatory Visit: Payer: Self-pay | Admitting: Internal Medicine

## 2022-02-02 ENCOUNTER — Other Ambulatory Visit (HOSPITAL_COMMUNITY): Payer: Self-pay

## 2022-02-02 DIAGNOSIS — I10 Essential (primary) hypertension: Secondary | ICD-10-CM

## 2022-02-02 MED ORDER — AMLODIPINE BESYLATE 5 MG PO TABS
5.0000 mg | ORAL_TABLET | Freq: Every day | ORAL | 11 refills | Status: DC
  Filled 2022-02-02: qty 30, 30d supply, fill #0
  Filled 2022-03-08: qty 30, 30d supply, fill #1
  Filled 2022-04-08: qty 30, 30d supply, fill #2
  Filled 2022-05-06: qty 30, 30d supply, fill #3
  Filled 2022-06-06: qty 30, 30d supply, fill #4
  Filled 2022-07-05: qty 30, 30d supply, fill #5
  Filled 2022-08-02 (×2): qty 30, 30d supply, fill #6
  Filled 2022-09-01: qty 30, 30d supply, fill #7
  Filled 2022-10-11: qty 30, 30d supply, fill #8
  Filled 2022-11-07: qty 30, 30d supply, fill #9
  Filled 2022-12-07: qty 30, 30d supply, fill #10

## 2022-02-02 MED ORDER — ROSUVASTATIN CALCIUM 20 MG PO TABS
20.0000 mg | ORAL_TABLET | Freq: Every day | ORAL | 11 refills | Status: DC
  Filled 2022-02-02: qty 30, 30d supply, fill #0
  Filled 2022-03-08: qty 30, 30d supply, fill #1
  Filled 2022-04-08: qty 30, 30d supply, fill #2
  Filled 2022-05-06: qty 30, 30d supply, fill #3
  Filled 2022-06-06: qty 30, 30d supply, fill #4
  Filled 2022-07-05: qty 30, 30d supply, fill #5
  Filled 2022-08-02 (×2): qty 30, 30d supply, fill #6
  Filled 2022-09-01: qty 30, 30d supply, fill #7
  Filled 2022-10-11: qty 30, 30d supply, fill #8
  Filled 2022-11-07: qty 30, 30d supply, fill #9
  Filled 2022-12-07: qty 30, 30d supply, fill #10

## 2022-02-07 ENCOUNTER — Other Ambulatory Visit (HOSPITAL_COMMUNITY): Payer: Self-pay

## 2022-02-07 MED ORDER — HYDROCHLOROTHIAZIDE 25 MG PO TABS
25.0000 mg | ORAL_TABLET | Freq: Every day | ORAL | 11 refills | Status: DC
  Filled 2022-02-07: qty 30, 30d supply, fill #0
  Filled 2022-03-08: qty 30, 30d supply, fill #1
  Filled 2022-04-08: qty 30, 30d supply, fill #2
  Filled 2022-05-06: qty 30, 30d supply, fill #3
  Filled 2022-06-06: qty 30, 30d supply, fill #4
  Filled 2022-07-05: qty 30, 30d supply, fill #5
  Filled 2022-08-02 (×2): qty 30, 30d supply, fill #6
  Filled 2022-09-01: qty 30, 30d supply, fill #7
  Filled 2022-10-11: qty 30, 30d supply, fill #8
  Filled 2022-11-07: qty 30, 30d supply, fill #9
  Filled 2022-12-07: qty 30, 30d supply, fill #10

## 2022-03-09 ENCOUNTER — Other Ambulatory Visit (HOSPITAL_COMMUNITY): Payer: Self-pay

## 2022-03-13 ENCOUNTER — Encounter: Payer: Self-pay | Admitting: Student

## 2022-03-13 ENCOUNTER — Other Ambulatory Visit: Payer: Self-pay

## 2022-03-13 ENCOUNTER — Ambulatory Visit (INDEPENDENT_AMBULATORY_CARE_PROVIDER_SITE_OTHER): Payer: PPO | Admitting: Student

## 2022-03-13 VITALS — BP 132/76 | HR 79 | Temp 97.7°F | Ht 72.0 in | Wt 290.1 lb

## 2022-03-13 DIAGNOSIS — Z0000 Encounter for general adult medical examination without abnormal findings: Secondary | ICD-10-CM

## 2022-03-13 DIAGNOSIS — R972 Elevated prostate specific antigen [PSA]: Secondary | ICD-10-CM | POA: Diagnosis not present

## 2022-03-13 DIAGNOSIS — Z23 Encounter for immunization: Secondary | ICD-10-CM | POA: Diagnosis not present

## 2022-03-13 DIAGNOSIS — K8689 Other specified diseases of pancreas: Secondary | ICD-10-CM

## 2022-03-13 DIAGNOSIS — I10 Essential (primary) hypertension: Secondary | ICD-10-CM | POA: Diagnosis not present

## 2022-03-13 DIAGNOSIS — E785 Hyperlipidemia, unspecified: Secondary | ICD-10-CM

## 2022-03-13 DIAGNOSIS — Z87891 Personal history of nicotine dependence: Secondary | ICD-10-CM

## 2022-03-13 NOTE — Assessment & Plan Note (Signed)
CTA chest/abd/pelvis on 12/2019 noted pancreas to be unremarkable. However, patient will likely need MRI for better imaging of the possible pancreatic mass. Discuss at next visit regarding follow-up imaging.

## 2022-03-13 NOTE — Assessment & Plan Note (Addendum)
Lipid Panel     Component Value Date/Time   CHOL 149 04/16/2020 0804   TRIG 86 04/16/2020 0804   HDL 44 04/16/2020 0804   CHOLHDL 3.4 04/16/2020 0804   LDLCALC 89 04/16/2020 0804   LABVLDL 16 04/16/2020 0804   Last lipid panel in 2021 with LDL <100.  He is on rosuvastatin 20 daily.  Tolerating medication and reports good adherence.  Plan -Continue rosuvastatin -Lipid panel today

## 2022-03-13 NOTE — Assessment & Plan Note (Addendum)
Patient received flu and pneumococcal vaccines today. Patient is due for colonoscopy. Last colonoscopy in 2018 revealed polyps and diverticulosis with biopsy that showed tubular adenoma. He is overdue for repeat colonoscopy.   Plan -referral to GI for colonoscopy

## 2022-03-13 NOTE — Patient Instructions (Addendum)
Thank you, Mr.Sricharan WOODWARD KLEM for allowing [place] to provide your care today.   Blood pressure -Repeat blood pressure today looks better. Please continue amlodipine 5 mg and HCTZ 25 mg daily.  Cholesterol -We will recheck cholesterol panel. Please continue with rosuvastatin 20 mg daily.  -Blood work today to check PSA level, electrolytes and kidney function.  -Referral to GI for colonoscopy. -Received flu and pneumococcal vaccines today.   I have ordered the following labs for you:  Lab Orders         BMP8+Anion Gap         PSA         Lipid Profile       Referrals ordered today:   Referral Orders         Ambulatory referral to Gastroenterology       I have ordered the following medication/changed the following medications:   Stop the following medications: There are no discontinued medications.   Start the following medications: No orders of the defined types were placed in this encounter.    Follow up: 3 months    Should you have any questions or concerns please call the internal medicine clinic at [phone].    Angelique Blonder, D.O. [place]

## 2022-03-13 NOTE — Assessment & Plan Note (Addendum)
BP: 132/76  BP on repeat improved today. He is on amlodipine 5 mg and HCTZ 25 mg daily. Tolerating medication and reports good adherence. Took both medication this morning about 1-2 hours before visit. Will need updated lab work today.   Plan -Continue amlodipine and HCTZ -BMP today

## 2022-03-13 NOTE — Progress Notes (Signed)
CC: Routine follow-up  HPI:  [name] is a 66 y.o. male living with a history stated below and presents today for routine follow-up. Please see problem based assessment and plan for additional details.  Past Medical History:  Diagnosis Date   Hypertension    Numbness of arm 06/20/2016    Current Outpatient Medications on File Prior to Visit  Medication Sig Dispense Refill   amLODipine (NORVASC) 5 MG tablet Take 1 tablet (5 mg total) by mouth daily. 30 tablet 11   Aspirin-Caffeine (BAYER BACK & BODY) 500-32.5 MG TABS Take 2 tablets by mouth daily as needed (pain).     hydrochlorothiazide (HYDRODIURIL) 25 MG tablet Take 1 tablet (25 mg total) by mouth daily. 30 tablet 11   IBUPROFEN PO Take by mouth as needed.     methocarbamol (ROBAXIN-750) 750 MG tablet Take 1-2 tablets (750-1,500 mg total) by mouth every 8 (eight) hours as needed for muscle spasms. 30 tablet 1   rosuvastatin (CRESTOR) 20 MG tablet Take 1 tablet (20 mg total) by mouth daily. 30 tablet 11   No current facility-administered medications on file prior to visit.   Review of Systems: ROS negative except for what is noted on the assessment and plan.  Vitals:   03/13/22 0921 03/13/22 0955  BP: (!) 151/85 132/76  Pulse: 82 79  Temp: 97.7 F (36.5 C)   TempSrc: Oral   SpO2: 99%   Weight: 290 lb 1.6 oz (131.6 kg)   Height: 6' (1.829 m)    Physical Exam: Constitutional: well-appearing male, sitting in chair, in no acute distress HENT: normocephalic atraumatic Neck: supple Cardiovascular: regular rate and rhythm, no m/r/g Pulmonary/Chest: normal work of breathing on room air, lungs clear to auscultation bilaterally MSK: normal bulk and tone Neurological: alert & oriented x 3 Skin: warm and dry Psych: normal mood and behavior  Assessment & Plan:   Essential hypertension BP: 132/76  BP on repeat improved today. He is on amlodipine 5 mg and HCTZ 25 mg daily. Tolerating medication and reports good  adherence. Took both medication this morning about 1-2 hours before visit. Will need updated lab work today.   Plan -Continue amlodipine and HCTZ -BMP today  Hyperlipidemia Lipid Panel     Component Value Date/Time   CHOL 149 04/16/2020 0804   TRIG 86 04/16/2020 0804   HDL 44 04/16/2020 0804   CHOLHDL 3.4 04/16/2020 0804   LDLCALC 89 04/16/2020 0804   LABVLDL 16 04/16/2020 0804   Last lipid panel in 2021 with LDL <100.  He is on rosuvastatin 20 daily.  Tolerating medication and reports good adherence.  Plan -Continue rosuvastatin -Lipid panel today  Healthcare maintenance Patient received flu and pneumococcal vaccines today. Patient is due for colonoscopy. Last colonoscopy in 2018 revealed polyps and diverticulosis with biopsy that showed tubular adenoma. He is overdue for repeat colonoscopy.   Plan -referral to GI for colonoscopy  Elevated PSA, less than 10 ng/ml Patient with history of elevated PSA. Last PSA was 6.5 in 2020 from 4.9 in 2019. It appears patient has not followed up with urology since second PSA. Endorses urinary frequency and incomplete voiding still.   Plan -repeat PSA level today, if remains elevated will need referral to urology  Pancreatic mass CTA chest/abd/pelvis on 12/2019 noted pancreas to be unremarkable. However, patient will likely need MRI for better imaging of the possible pancreatic mass. Discuss at next visit regarding follow-up imaging.    Patient seen with Dr. Posey Pronto  Markus Jarvis, D.O. [facility], PGY-1 Phone: [phone] Date 03/13/2022 Time 12:51 PM

## 2022-03-13 NOTE — Assessment & Plan Note (Signed)
Patient with history of elevated PSA. Last PSA was 6.5 in 2020 from 4.9 in 2019. It appears patient has not followed up with urology since second PSA. Endorses urinary frequency and incomplete voiding still.   Plan -repeat PSA level today, if remains elevated will need referral to urology

## 2022-03-14 LAB — BMP8+ANION GAP
Anion Gap: 16 mmol/L (ref 10.0–18.0)
BUN/Creatinine Ratio: 9 — ABNORMAL LOW (ref 10–24)
BUN: 11 mg/dL (ref 8–27)
CO2: 27 mmol/L (ref 20–29)
Calcium: 9.6 mg/dL (ref 8.6–10.2)
Chloride: 98 mmol/L (ref 96–106)
Creatinine, Ser: 1.21 mg/dL (ref 0.76–1.27)
Glucose: 117 mg/dL — ABNORMAL HIGH (ref 70–99)
Potassium: 3.9 mmol/L (ref 3.5–5.2)
Sodium: 141 mmol/L (ref 134–144)
eGFR: 66 mL/min/{1.73_m2} (ref >59)

## 2022-03-14 LAB — LIPID PANEL
Chol/HDL Ratio: 3.5 ratio (ref 0.0–5.0)
Cholesterol, Total: 170 mg/dL (ref 100–199)
HDL: 48 mg/dL (ref >39)
LDL Chol Calc (NIH): 105 mg/dL — ABNORMAL HIGH (ref 0–99)
Triglycerides: 90 mg/dL (ref 0–149)
VLDL Cholesterol Cal: 17 mg/dL (ref 5–40)

## 2022-03-14 LAB — PSA: Prostate Specific Ag, Serum: 11 ng/mL — ABNORMAL HIGH (ref 0.0–4.0)

## 2022-03-16 NOTE — Addendum Note (Signed)
Addended by: Angelique Blonder on: 03/16/2022 11:25 AM   Modules accepted: Orders

## 2022-03-30 NOTE — Addendum Note (Signed)
Addended by: Gilles Chiquito B on: 03/30/2022 07:17 PM   Modules accepted: Level of Service

## 2022-03-30 NOTE — Progress Notes (Signed)
Internal Medicine Clinic Attending  Case discussed with Dr. Markus Jarvis  at the time of the visit.  We reviewed the resident's history and exam and pertinent patient test results.  I agree with the assessment, diagnosis, and plan of care documented in the resident's note.

## 2022-04-10 ENCOUNTER — Other Ambulatory Visit (HOSPITAL_COMMUNITY): Payer: Self-pay

## 2022-05-23 ENCOUNTER — Encounter: Payer: Self-pay | Admitting: Internal Medicine

## 2022-07-03 ENCOUNTER — Other Ambulatory Visit (HOSPITAL_COMMUNITY): Payer: Self-pay

## 2022-07-03 ENCOUNTER — Ambulatory Visit (AMBULATORY_SURGERY_CENTER): Payer: PPO

## 2022-07-03 ENCOUNTER — Other Ambulatory Visit: Payer: Self-pay

## 2022-07-03 ENCOUNTER — Telehealth: Payer: Self-pay | Admitting: Internal Medicine

## 2022-07-03 VITALS — Ht 72.0 in | Wt 270.0 lb

## 2022-07-03 DIAGNOSIS — Z86010 Personal history of colonic polyps: Secondary | ICD-10-CM

## 2022-07-03 DIAGNOSIS — R200 Anesthesia of skin: Secondary | ICD-10-CM | POA: Insufficient documentation

## 2022-07-03 MED ORDER — NA SULFATE-K SULFATE-MG SULF 17.5-3.13-1.6 GM/177ML PO SOLN: 1.0000 | Freq: Once | ORAL | 0 refills | Status: AC

## 2022-07-03 MED ORDER — NA SULFATE-K SULFATE-MG SULF 17.5-3.13-1.6 GM/177ML PO SOLN
1.0000 | Freq: Once | ORAL | 0 refills | Status: DC
  Filled 2022-07-03: qty 354, 1d supply, fill #0

## 2022-07-03 NOTE — Telephone Encounter (Signed)
New rx sent into preferred pharmacy. Pt made aware.

## 2022-07-03 NOTE — Telephone Encounter (Signed)
Inbound call from pt, patient requested to have his prep kit send to [address] ,[place], [place], [place] [address]" Please advise

## 2022-07-03 NOTE — Progress Notes (Signed)
No egg or soy allergy known to patient  No issues known to pt with past sedation with any surgeries or procedures - it was reported to him that it took him a while to wake up after his spine surgery  Patient denies ever being told they had issues or difficulty with intubation  No FH of Malignant Hyperthermia Pt is not on diet pills Pt is not on  home 02  Pt is not on blood thinners  Pt denies issues with constipation  No A fib or A flutter Have any cardiac testing pending-- no  Pt instructed to use Single[employer]com or GoodRx for a price reduction on prep   Patient's chart reviewed by Osvaldo Angst CNRA prior to previsit and patient appropriate for the [facility].  Previsit completed and red dot placed by patient's name on their procedure day (on provider's schedule).

## 2022-07-05 ENCOUNTER — Other Ambulatory Visit (HOSPITAL_COMMUNITY): Payer: Self-pay

## 2022-07-07 ENCOUNTER — Encounter: Payer: Self-pay | Admitting: Internal Medicine

## 2022-07-28 ENCOUNTER — Other Ambulatory Visit: Payer: Self-pay | Admitting: *Deleted

## 2022-07-28 ENCOUNTER — Other Ambulatory Visit: Payer: Self-pay

## 2022-07-28 ENCOUNTER — Telehealth: Payer: Self-pay | Admitting: Internal Medicine

## 2022-07-28 IMAGING — MR MR CERVICAL SPINE W/O CM
5 series · 34 of 48 positions shown · non-contrast
Comparison: Prior radiograph from 07/12/2016.

CLINICAL DATA: Initial evaluation for cervical radiculopathy.

EXAM:
MRI CERVICAL SPINE WITHOUT CONTRAST
TECHNIQUE: Multiplanar, multisequence MR imaging of the cervical spine was
performed. No intravenous contrast was administered.

[Series 5: T2 · sagittal · 3.0mm · 0.69mm/px · 6 of 15 slices shown (1 of 2)]
[im 1/15]
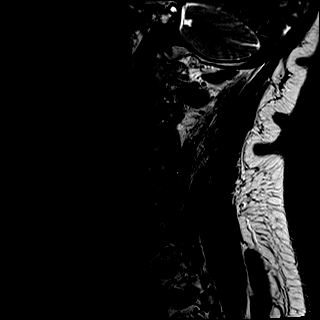
[im 3/15]
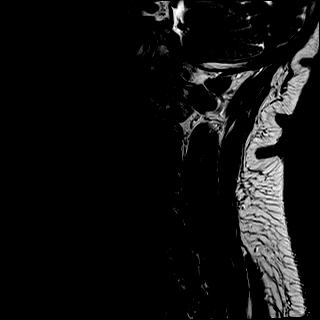
[im 6/15]
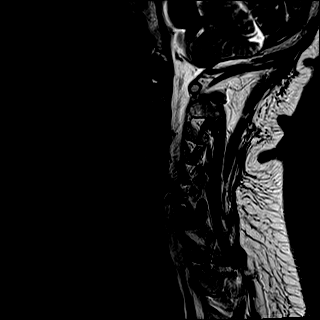
[im 9/15]
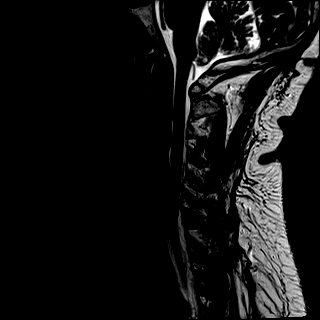
[im 12/15]
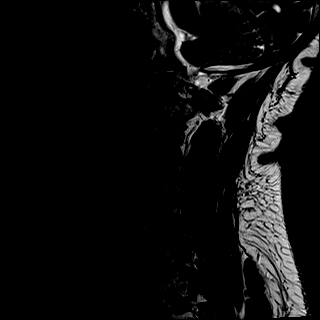
[im 15/15]
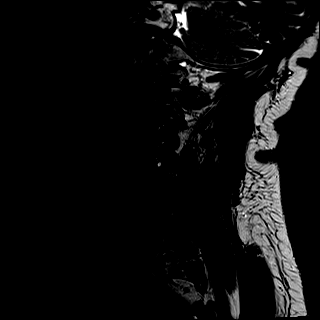

[Series 6: T1 · sagittal · 3.0mm · 0.69mm/px · 6 of 15 slices shown]
[im 1/15]
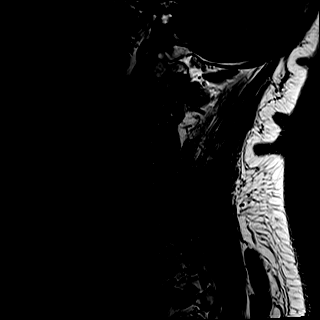
[im 3/15]
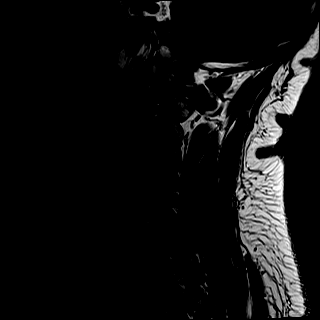
[im 6/15]
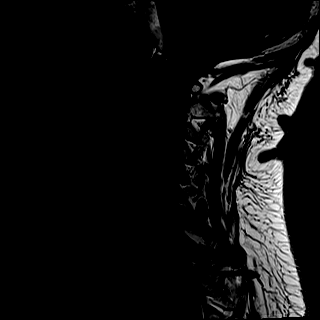
[im 9/15]
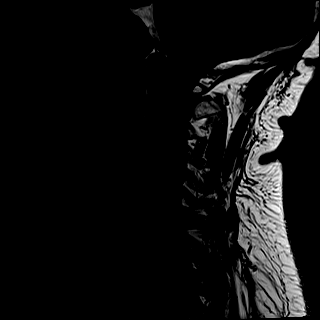
[im 12/15]
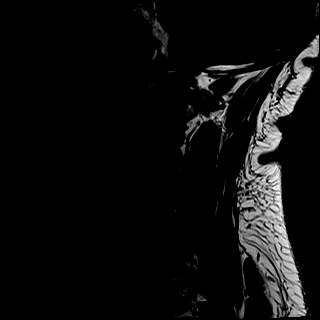
[im 15/15]
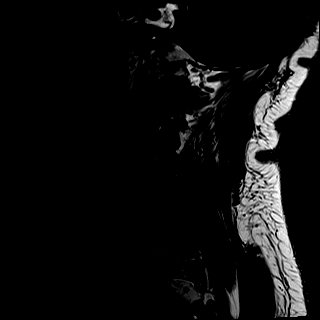

[Series 7: STIR · sagittal · 3.0mm · 0.86mm/px · 6 of 15 slices shown]
[im 1/15]
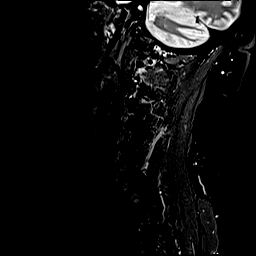
[im 3/15]
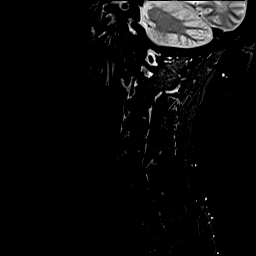
[im 6/15]
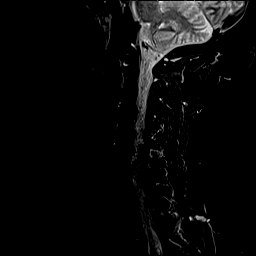
[im 9/15]
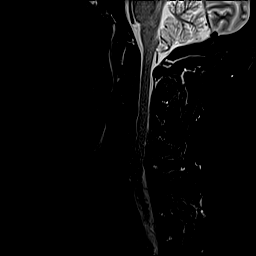
[im 12/15]
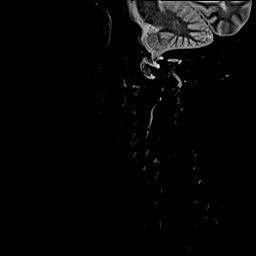
[im 15/15]
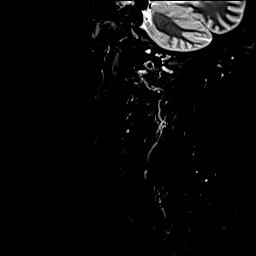

[Series 8: T2 · axial · 3.0mm · 0.66mm/px · z∈[-55,+55]mm · 9 of 36 slices shown (2 of 2)]
[im 1/36]
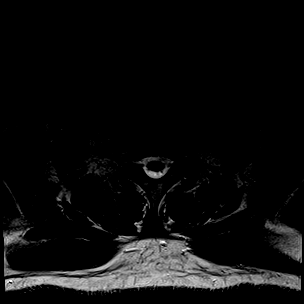
[im 6/36]
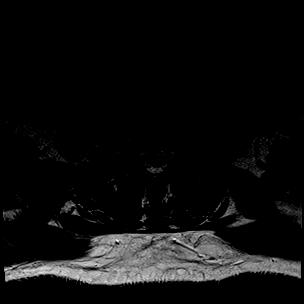
[im 11/36]
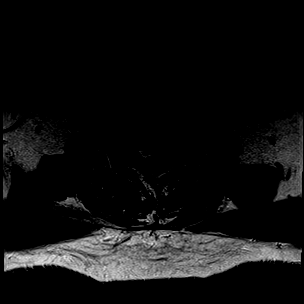
[im 16/36]
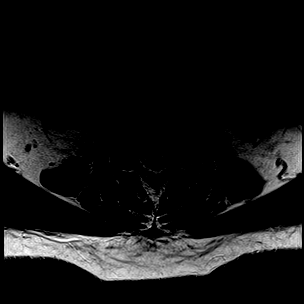
[im 18/36]
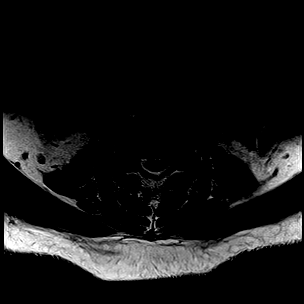
[im 21/36]
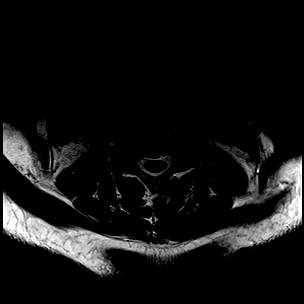
[im 26/36]
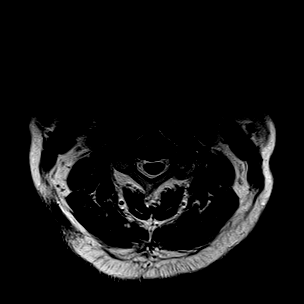
[im 31/36]
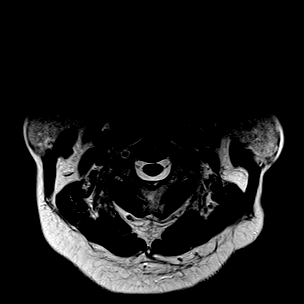
[im 36/36]
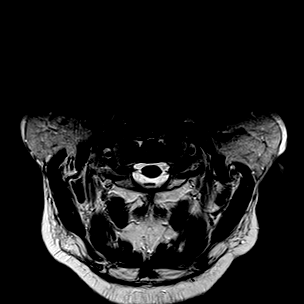

[Series 9: GRE · axial · 3.0mm · 0.39mm/px · z∈[-55,+39]mm · 7 of 36 slices shown]
[im 1/36]
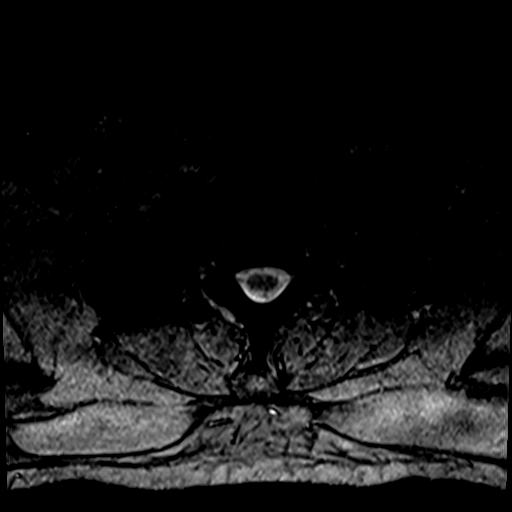
[im 6/36]
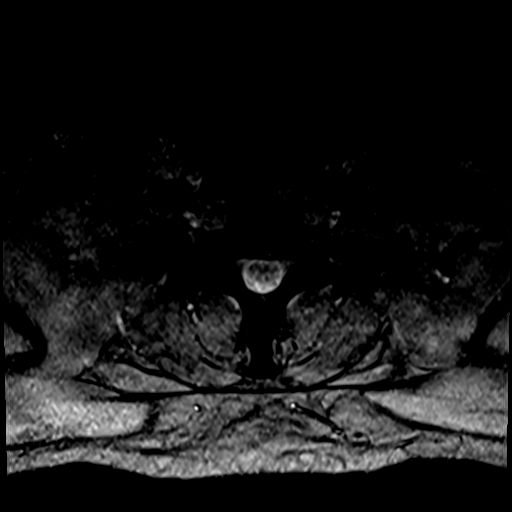
[im 11/36]
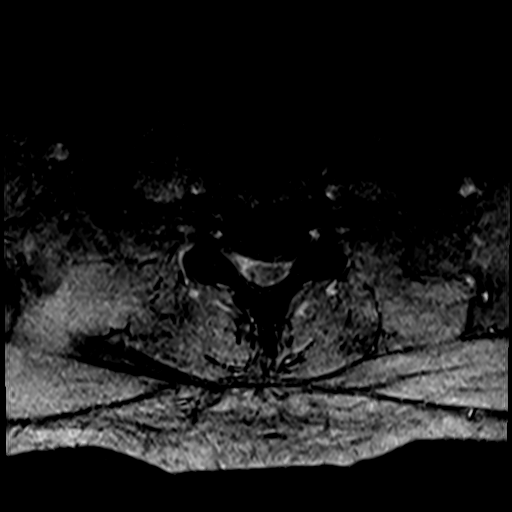
[im 16/36]
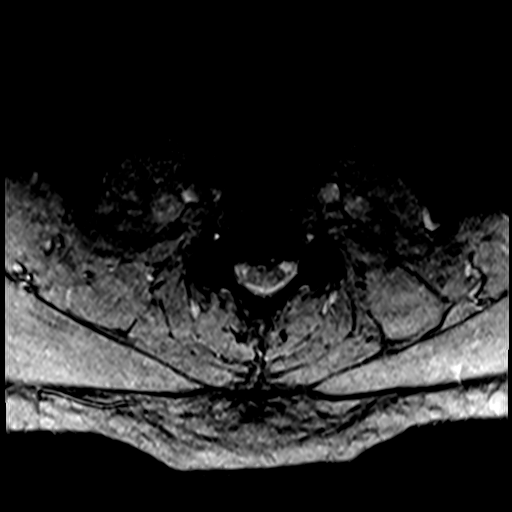
[im 21/36]
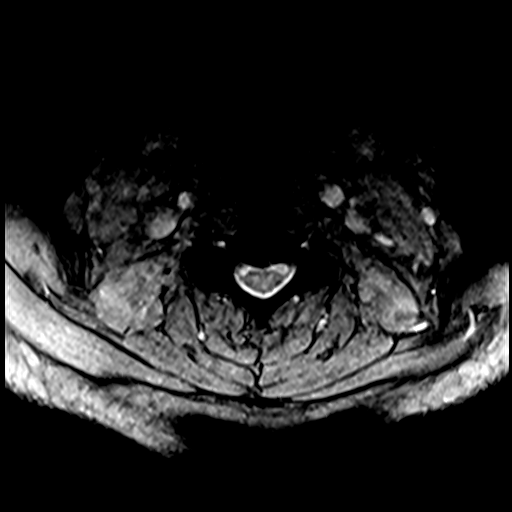
[im 26/36]
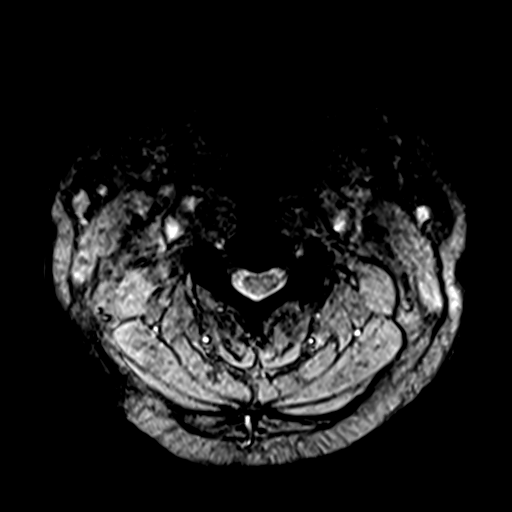
[im 31/36]
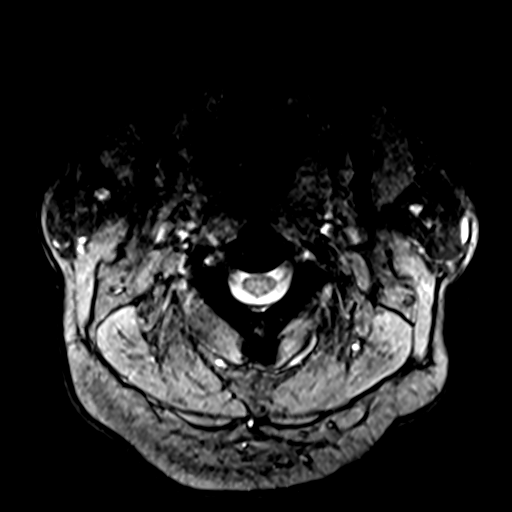

[34 of 48 positions shown; findings below may reference images not displayed]

FINDINGS: Alignment: Straightening of the normal cervical lordosis. No
listhesis.

Vertebrae: Vertebral body height maintained without acute or chronic
fracture. Bone marrow signal intensity within normal limits. No
discrete or worrisome osseous lesions. No abnormal marrow edema.

Cord: Normal signal and morphology.

Posterior Fossa, vertebral arteries, paraspinal tissues: Visualized
brain and posterior fossa within normal limits. Craniocervical
junction normal. Paraspinous and prevertebral soft tissues within
normal limits. Normal intravascular flow voids seen within the
vertebral arteries bilaterally.

Disc levels:

C2-C3: Mild disc bulge. Mild to moderate left greater than right
facet hypertrophy. No significant spinal stenosis. Foramina remain
patent.

C3-C4: Disc bulge with right greater than left uncovertebral
hypertrophy. Moderate right with mild left facet degeneration.
Posterior disc osteophyte mildly flattens the ventral thecal sac
without significant spinal stenosis. Severe right with mild to
moderate left C4 foraminal narrowing.

C4-C5: Diffuse disc bulge with bilateral uncovertebral and facet
hypertrophy. Posterior disc osteophyte flattens and partially faces
the ventral thecal sac with resultant mild spinal stenosis. Moderate
to severe bilateral C5 foraminal stenosis.

C5-C6: Degenerative intervertebral disc space narrowing. Broad-based
right eccentric disc osteophyte complex flattens and effaces the
ventral thecal sac, contacting and flattening the ventral spinal
cord. No cord signal changes. Superimposed mild facet and ligament
flavum hypertrophy. Resultant moderate spinal stenosis. Severe right
worse than left C6 foraminal narrowing.

C6-C7: Degenerative intervertebral disc space narrowing.
Moderate-sized left paracentral disc osteophyte complex indents the
ventral thecal sac, contacting and flattening the left hemi cord. No
cord signal changes. Superimposed mild facet and ligament flavum
hypertrophy. Resultant moderate spinal stenosis. Severe left with
moderate right C7 foraminal narrowing.

C7-T1: Minimal disc bulge with uncovertebral hypertrophy, greater on
the right. Bilateral facet degeneration. No spinal stenosis.
Moderate to severe right with moderate left C8 foraminal narrowing.

Visualized upper thoracic spine demonstrates no significant finding.
IMPRESSION: 1. Multilevel cervical spondylosis with resultant mild to moderate
spinal stenosis at C4-5 through C6-7.
2. Multifactorial degenerative changes with resultant moderate to
severe bilateral C4 through C8 foraminal stenosis as above.

## 2022-07-28 MED ORDER — NA SULFATE-K SULFATE-MG SULF 17.5-3.13-1.6 GM/177ML PO SOLN: 1.0000 | Freq: Once | ORAL | 0 refills | Status: AC

## 2022-07-28 NOTE — Telephone Encounter (Signed)
PT has a colonoscopy on 3/25 and his wife went to pick up prep and it has not been sent in. It needs to be sent to [facility] on [address].

## 2022-07-28 NOTE — Telephone Encounter (Signed)
Spoke with pt and had Suprep sent to [address] location.

## 2022-07-31 ENCOUNTER — Ambulatory Visit (AMBULATORY_SURGERY_CENTER): Payer: PPO | Admitting: Internal Medicine

## 2022-07-31 ENCOUNTER — Encounter: Payer: Self-pay | Admitting: Internal Medicine

## 2022-07-31 VITALS — BP 131/76 | HR 76 | Temp 96.2°F | Resp 20 | Ht 72.0 in | Wt 276.0 lb

## 2022-07-31 DIAGNOSIS — Z86010 Personal history of colonic polyps: Secondary | ICD-10-CM | POA: Diagnosis not present

## 2022-07-31 DIAGNOSIS — D123 Benign neoplasm of transverse colon: Secondary | ICD-10-CM

## 2022-07-31 DIAGNOSIS — Z09 Encounter for follow-up examination after completed treatment for conditions other than malignant neoplasm: Secondary | ICD-10-CM | POA: Diagnosis not present

## 2022-07-31 DIAGNOSIS — D120 Benign neoplasm of cecum: Secondary | ICD-10-CM | POA: Diagnosis not present

## 2022-07-31 MED ORDER — SODIUM CHLORIDE 0.9 % IV SOLN: 500.0000 mL | Freq: Once | INTRAVENOUS | Status: DC

## 2022-07-31 NOTE — Progress Notes (Signed)
Uneventful anesthetic. Report to pacu rn. Vss. Care resumed by rn. 

## 2022-07-31 NOTE — Progress Notes (Signed)
GASTROENTEROLOGY PROCEDURE H&P NOTE   Primary Care Physician: Angelique Blonder, DO    Reason for Procedure:   History of colon polyps  Plan:    Colonoscopy  Patient is appropriate for endoscopic procedure(s) in the ambulatory ([facility]) setting.  The nature of the procedure, as well as the risks, benefits, and alternatives were carefully and thoroughly reviewed with the patient. Ample time for discussion and questions allowed. The patient understood, was satisfied, and agreed to proceed.     HPI: Harold Hayes is a 67 y.o. male who presents for colonoscopy for history of colon polyps. Denies blood in stools, changes in bowel habits, or unintentional weight loss. Some of his cousins have had colon cancer.   Past Medical History:  Diagnosis Date   Hyperlipidemia    Hypertension    Numbness of arm 06/20/2016    Past Surgical History:  Procedure Laterality Date   COLONOSCOPY     SPINE SURGERY      Prior to Admission medications   Medication Sig Start Date End Date Taking? Authorizing Provider  amLODipine (NORVASC) 5 MG tablet Take 1 tablet (5 mg total) by mouth daily. 02/02/22   Delene Ruffini, MD  Aspirin-Caffeine (BAYER BACK & BODY) 500-32.5 MG TABS Take 2 tablets by mouth daily as needed (pain). Patient not taking: Reported on 07/03/2022    [provider]  hydrochlorothiazide (HYDRODIURIL) 25 MG tablet Take 1 tablet (25 mg total) by mouth daily. 02/07/22   Virl Axe, MD  IBUPROFEN PO Take by mouth as needed.    [provider]  methocarbamol (ROBAXIN-750) 750 MG tablet Take 1-2 tablets (750-1,500 mg total) by mouth every 8 (eight) hours as needed for muscle spasms. Patient not taking: Reported on 07/03/2022 01/12/21   Delene Ruffini, MD  rosuvastatin (CRESTOR) 20 MG tablet Take 1 tablet (20 mg total) by mouth daily. 02/02/22   Delene Ruffini, MD    Current Outpatient Medications  Medication Sig Dispense Refill   amLODipine (NORVASC) 5 MG tablet  Take 1 tablet (5 mg total) by mouth daily. 30 tablet 11   Aspirin-Caffeine (BAYER BACK & BODY) 500-32.5 MG TABS Take 2 tablets by mouth daily as needed (pain). (Patient not taking: Reported on 07/03/2022)     hydrochlorothiazide (HYDRODIURIL) 25 MG tablet Take 1 tablet (25 mg total) by mouth daily. 30 tablet 11   IBUPROFEN PO Take by mouth as needed.     methocarbamol (ROBAXIN-750) 750 MG tablet Take 1-2 tablets (750-1,500 mg total) by mouth every 8 (eight) hours as needed for muscle spasms. (Patient not taking: Reported on 07/03/2022) 30 tablet 1   rosuvastatin (CRESTOR) 20 MG tablet Take 1 tablet (20 mg total) by mouth daily. 30 tablet 11   Current Facility-Administered Medications  Medication Dose Route Frequency Provider Last Rate Last Admin   0.9 %  sodium chloride infusion  500 mL Intravenous Once Sharyn Creamer, MD        Allergies as of 07/31/2022 - Review Complete 07/03/2022  Allergen Reaction Noted   Lisinopril Cough and Shortness Of Breath 02/13/2020    Family History  Problem Relation Age of Onset   Diabetes Mother    Diabetes Sister    Diabetes Sister    Hypertension Daughter    Colon cancer Neg Hx    Esophageal cancer Neg Hx    Rectal cancer Neg Hx    Stomach cancer Neg Hx    Colon polyps Neg Hx     Social History   Socioeconomic  History   Marital status: Married    Spouse name: Not on file   Number of children: Not on file   Years of education: Not on file   Highest education level: Not on file  Occupational History   Not on file  Tobacco Use   Smoking status: Former    Packs/day: 1.50    Years: 20.00    Additional pack years: 0.00    Total pack years: 30.00    Types: Cigarettes    Quit date: 05/08/1988    Years since quitting: 34.2   Smokeless tobacco: Never  Vaping Use   Vaping Use: Never used  Substance and Sexual Activity   Alcohol use: No   Drug use: No    Comment: 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   Sexual activity: Yes     Birth control/protection: None  Other Topics Concern   Not on file  Social History Narrative   Not on file   Social Determinants of Health   Financial Resource Strain: Not on file  Food Insecurity: Food Insecurity Present (03/13/2022)   Hunger Vital Sign    Worried About Running Out of Food in the Last Year: Never true    Ran Out of Food in the Last Year: Sometimes true  Transportation Needs: No Transportation Needs (03/13/2022)   PRAPARE - Hydrologist (Medical): No    Lack of Transportation (Non-Medical): No  Physical Activity: Not on file  Stress: Not on file  Social Connections: Not on file  Intimate Partner Violence: Not At Risk (03/13/2022)   Humiliation, Afraid, Rape, and Kick questionnaire    Fear of Current or Ex-Partner: No    Emotionally Abused: No    Physically Abused: No    Sexually Abused: No    Physical Exam: Vital signs in last 24 hours: BP (!) 158/94   Pulse 82   Temp (!) 96.2 F (35.7 C) (Temporal)   Ht 6' (1.829 m)   Wt 276 lb (125.2 kg)   SpO2 97%   BMI 37.43 kg/m  GEN: NAD EYE: Sclerae anicteric ENT: MMM CV: Non-tachycardic Pulm: No increased work of breathing GI: Soft, NT/ND NEURO:  Alert & Oriented   Christia Reading, MD [facility]  07/31/2022 10:44 AM

## 2022-07-31 NOTE — Op Note (Signed)
[facility] Patient Name: Harold Hayes Procedure Date: 07/31/2022 11:14 AM MRN: [id number] Endoscopist: Madelyn Brunner [place] , , [id number] Age: 67 Referring MD:  Date of Birth: 1955/06/13 Gender: Male Account #: [id number] Procedure:                Colonoscopy Indications:              High risk colon cancer surveillance: Personal                            history of colonic polyps Medicines:                Monitored Anesthesia Care Procedure:                Pre-Anesthesia Assessment:                           - Prior to the procedure, a History and Physical                            was performed, and patient medications and                            allergies were reviewed. The patient's tolerance of                            previous anesthesia was also reviewed. The risks                            and benefits of the procedure and the sedation                            options and risks were discussed with the patient.                            All questions were answered, and informed consent                            was obtained. Prior Anticoagulants: The patient has                            taken no anticoagulant or antiplatelet agents. ASA                            Grade Assessment: II - A patient with mild systemic                            disease. After reviewing the risks and benefits,                            the patient was deemed in satisfactory condition to                            undergo the procedure.  After obtaining informed consent, the colonoscope                            was passed under direct vision. Throughout the                            procedure, the patient's blood pressure, pulse, and                            oxygen saturations were monitored continuously. The                            CF [id number] #[id number] was introduced through the anus                            and advanced to the the  terminal ileum. The                            colonoscopy was performed without difficulty. The                            patient tolerated the procedure well. The quality                            of the bowel preparation was excellent. The                            terminal ileum, ileocecal valve, appendiceal                            orifice, and rectum were photographed. Scope In: 11:22:34 AM Scope Out: 11:39:16 AM Scope Withdrawal Time: 0 hours 12 minutes 37 seconds  Total Procedure Duration: 0 hours 16 minutes 42 seconds  Findings:                 The terminal ileum appeared normal.                           Multiple diverticula were found in the entire colon.                           Five sessile polyps were found in the transverse                            colon and cecum. The polyps were 3 to 7 mm in size.                            These polyps were removed with a cold snare.                            Resection and retrieval were complete.                           There was a medium-sized lipoma, in the  ascending                            colon.                           Non-bleeding internal hemorrhoids were found during                            retroflexion. Complications:            No immediate complications. Estimated Blood Loss:     Estimated blood loss was minimal. Impression:               - The examined portion of the ileum was normal.                           - Diverticulosis in the entire examined colon.                           - Five 3 to 7 mm polyps in the transverse colon and                            in the cecum, removed with a cold snare. Resected                            and retrieved.                           - Medium-sized lipoma in the ascending colon.                           - Non-bleeding internal hemorrhoids. Recommendation:           - Discharge patient to home (with escort).                           - Await pathology results.                            - The findings and recommendations were discussed                            with the patient. Dr Particia Lather "[facility]" Leonides Schanz,  07/31/2022 11:43:00 AM

## 2022-07-31 NOTE — Patient Instructions (Signed)
YOU HAD AN ENDOSCOPIC PROCEDURE TODAY AT [facility]:   Refer to the procedure report that was given to you for any specific questions about what was found during the examination.  If the procedure report does not answer your questions, please call your gastroenterologist to clarify.  If you requested that your care partner not be given the details of your procedure findings, then the procedure report has been included in a sealed envelope for you to review at your convenience later.  YOU SHOULD EXPECT: Some feelings of bloating in the abdomen. Passage of more gas than usual.  Walking can help get rid of the air that was put into your GI tract during the procedure and reduce the bloating. If you had a lower endoscopy (such as a colonoscopy or flexible sigmoidoscopy) you may notice spotting of blood in your stool or on the toilet paper. If you underwent a bowel prep for your procedure, you may not have a normal bowel movement for a few days.  Please Note:  You might notice some irritation and congestion in your nose or some drainage.  This is from the oxygen used during your procedure.  There is no need for concern and it should clear up in a day or so.  SYMPTOMS TO REPORT IMMEDIATELY:  Following lower endoscopy (colonoscopy or flexible sigmoidoscopy):  Excessive amounts of blood in the stool  Significant tenderness or worsening of abdominal pains  Swelling of the abdomen that is new, acute  Fever of 100F or higher   For urgent or emergent issues, a gastroenterologist can be reached at any hour by calling [phone]. Do not use MyChart messaging for urgent concerns.    DIET:  We do recommend a small meal at first, but then you may proceed to your regular diet.  Drink plenty of fluids but you should avoid alcoholic beverages for 24 hours.  MEDICATIONS: Resume present medications.   FOLLOW UP: Await pathology results.  Please see handouts given to you by your recovery  nurse: Polyps, Diverticulosis, Hemorrhoids.  Thank you for allowing [place] to provide for your healthcare needs today.  ACTIVITY:  You should plan to take it easy for the rest of today and you should NOT DRIVE or use heavy machinery until tomorrow (because of the sedation medicines used during the test).    FOLLOW UP: Our staff will call the number listed on your records the next business day following your procedure.  We will call around 7:15- 8:00 am to check on you and address any questions or concerns that you may have regarding the information given to you following your procedure. If we do not reach you, we will leave a message.     If any biopsies were taken you will be contacted by phone or by letter within the next 1-3 weeks.  Please call us at [phone] if you have not heard about the biopsies in 3 weeks.    SIGNATURES/CONFIDENTIALITY: You and/or your care partner have signed paperwork which will be entered into your electronic medical record.  These signatures attest to the fact that that the information above on your After Visit Summary has been reviewed and is understood.  Full responsibility of the confidentiality of this discharge information lies with you and/or your care-partner.

## 2022-07-31 NOTE — Progress Notes (Signed)
VS completed by EC.   Pt's states no medical or surgical changes since previsit or office visit.  

## 2022-07-31 NOTE — Progress Notes (Signed)
Called to room to assist during endoscopic procedure.  Patient ID and intended procedure confirmed with present staff. Received instructions for my participation in the procedure from the performing physician.  

## 2022-08-01 ENCOUNTER — Telehealth: Payer: Self-pay

## 2022-08-01 NOTE — Telephone Encounter (Signed)
  Follow up Call-     07/31/2022   10:41 AM  Call back number  Post procedure Call Back phone  # [phone]  Permission to leave phone message Yes     Patient questions:  Do you have a fever, pain , or abdominal swelling? No. Pain Score  0 *  Have you tolerated food without any problems? Yes.    Have you been able to return to your normal activities? Yes.    Do you have any questions about your discharge instructions: Diet   No. Medications  No. Follow up visit  No.  Do you have questions or concerns about your Care? No.  Actions: * If pain score is 4 or above: No action needed, pain <4.

## 2022-08-02 ENCOUNTER — Other Ambulatory Visit (HOSPITAL_COMMUNITY): Payer: Self-pay

## 2022-08-09 ENCOUNTER — Encounter: Payer: Self-pay | Admitting: Internal Medicine

## 2022-09-01 ENCOUNTER — Other Ambulatory Visit (HOSPITAL_COMMUNITY): Payer: Self-pay

## 2022-09-20 ENCOUNTER — Telehealth: Payer: Self-pay | Admitting: Student

## 2022-09-20 NOTE — Telephone Encounter (Signed)
Called patient to schedule Medicare Annual Wellness Visit (AWV). Left message for patient to call back and schedule Medicare Annual Wellness Visit (AWV).   AWV-I: 09/05/2021  Please schedule an appointment at any time with [facility].  If any questions, please contact me at [phone].  [facility] Care Guide [facility] AWV TEAM Direct Dial: [phone]

## 2022-10-11 ENCOUNTER — Other Ambulatory Visit (HOSPITAL_COMMUNITY): Payer: Self-pay

## 2022-10-13 ENCOUNTER — Other Ambulatory Visit (HOSPITAL_COMMUNITY): Payer: Self-pay

## 2022-11-07 ENCOUNTER — Other Ambulatory Visit (HOSPITAL_COMMUNITY): Payer: Self-pay

## 2022-11-07 ENCOUNTER — Other Ambulatory Visit: Payer: Self-pay

## 2022-12-07 ENCOUNTER — Other Ambulatory Visit (HOSPITAL_COMMUNITY): Payer: Self-pay

## 2022-12-27 ENCOUNTER — Ambulatory Visit (INDEPENDENT_AMBULATORY_CARE_PROVIDER_SITE_OTHER): Payer: PPO

## 2022-12-27 VITALS — Ht 73.0 in | Wt 276.0 lb

## 2022-12-27 DIAGNOSIS — Z0000 Encounter for general adult medical examination without abnormal findings: Secondary | ICD-10-CM

## 2022-12-27 NOTE — Progress Notes (Signed)
Subjective:   Harold Hayes is a 67 y.o. male who presents for an Initial Medicare Annual Wellness Visit.  Visit Complete: Virtual  I connected with  Alvina Filbert on 12/27/22 by a audio enabled telemedicine application and verified that I am speaking with the correct person using two identifiers.  Patient Location: Home  Provider Location: Home Office  I discussed the limitations of evaluation and management by telemedicine. The patient expressed understanding and agreed to proceed.  Vital Signs: Because this visit was a virtual/telehealth visit, some criteria may be missing or patient reported. Any vitals not documented were not able to be obtained and vitals that have been documented are patient reported.    Review of Systems    Cardiac Risk Factors include: advanced age (>9men, >78 women);dyslipidemia;hypertension;male gender     Objective:    Today's Vitals   12/27/22 0817  Weight: 276 lb (125.2 kg)  Height: 6\' 1"  (1.854 m)   Body mass index is 36.41 kg/m.     12/27/2022    8:26 AM 03/13/2022    9:17 AM 01/12/2021    9:31 AM 10/27/2020   10:53 AM 12/15/2019    3:36 PM 06/28/2018    3:39 PM 05/23/2018    1:25 PM  Advanced Directives  Does Patient Have a Medical Advance Directive? Yes No No No No No No  Type of Estate agent of [place];Living will        Copy of Healthcare Power of Attorney in Chart? No - copy requested        Would patient like information on creating a medical advance directive?  No - Patient declined No - Patient declined No - Patient declined No - Patient declined No - Patient declined No - Patient declined    Current Medications (verified) Outpatient Encounter Medications as of 12/27/2022  Medication Sig   amLODipine (NORVASC) 5 MG tablet Take 1 tablet (5 mg total) by mouth daily.   hydrochlorothiazide (HYDRODIURIL) 25 MG tablet Take 1 tablet (25 mg total) by mouth daily.   IBUPROFEN PO Take by mouth as needed.    rosuvastatin (CRESTOR) 20 MG tablet Take 1 tablet (20 mg total) by mouth daily.   Aspirin-Caffeine (BAYER BACK & BODY) 500-32.5 MG TABS Take 2 tablets by mouth daily as needed (pain). (Patient not taking: Reported on 12/27/2022)   methocarbamol (ROBAXIN-750) 750 MG tablet Take 1-2 tablets (750-1,500 mg total) by mouth every 8 (eight) hours as needed for muscle spasms. (Patient not taking: Reported on 12/27/2022)   Facility-Administered Encounter Medications as of 12/27/2022  Medication   0.9 %  sodium chloride infusion    Allergies (verified) Lisinopril   History: Past Medical History:  Diagnosis Date   Hyperlipidemia    Hypertension    Numbness of arm 06/20/2016   Past Surgical History:  Procedure Laterality Date   COLONOSCOPY     SPINE SURGERY     Family History  Problem Relation Age of Onset   Diabetes Mother    Diabetes Sister    Diabetes Sister    Hypertension Daughter    Colon cancer Neg Hx    Esophageal cancer Neg Hx    Rectal cancer Neg Hx    Stomach cancer Neg Hx    Colon polyps Neg Hx    Social History   Socioeconomic History   Marital status: Married    Spouse name: Johny Shock   Number of children: 4   Years of education: Not on file  Highest education level: Not on file  Occupational History   Occupation: working full time  Tobacco Use   Smoking status: Former    Current packs/day: 0.00    Average packs/day: 1.5 packs/day for 20.0 years (30.0 ttl pk-yrs)    Types: Cigarettes    Start date: 05/08/1968    Quit date: 05/08/1988    Years since quitting: 34.6   Smokeless tobacco: Never  Vaping Use   Vaping status: Never Used  Substance and Sexual Activity   Alcohol use: No   Drug use: No    Comment: 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   Sexual activity: Yes    Birth control/protection: None  Other Topics Concern   Not on file  Social History Narrative   Not on file   Social Determinants of Health   Financial Resource Strain: High  Risk (12/27/2022)   Overall Financial Resource Strain (CARDIA)    Difficulty of Paying Living Expenses: Hard  Food Insecurity: No Food Insecurity (12/27/2022)   Hunger Vital Sign    Worried About Running Out of Food in the Last Year: Never true    Ran Out of Food in the Last Year: Never true  Transportation Needs: No Transportation Needs (12/27/2022)   PRAPARE - Administrator, Civil Service (Medical): No    Lack of Transportation (Non-Medical): No  Physical Activity: Insufficiently Active (12/27/2022)   Exercise Vital Sign    Days of Exercise per Week: 5 days    Minutes of Exercise per Session: 20 min  Stress: No Stress Concern Present (12/27/2022)   [employer] of Occupational Health - Occupational Stress Questionnaire    Feeling of Stress : Not at all  Social Connections: Moderately Integrated (12/27/2022)   Social Connection and Isolation Panel [NHANES]    Frequency of Communication with Friends and Family: Three times a week    Frequency of Social Gatherings with Friends and Family: Three times a week    Attends Religious Services: More than 4 times per year    Active Member of Clubs or Organizations: No    Attends Banker Meetings: Never    Marital Status: Married    Tobacco Counseling Counseling given: Not Answered   Clinical Intake:  Pre-visit preparation completed: Yes  Pain : No/denies pain     BMI - recorded: 36.41 Nutritional Risks: None Diabetes: No  How often do you need to have someone help you when you read instructions, pamphlets, or other written materials from your doctor or pharmacy?: 1 - Never  Interpreter Needed?: No  Information entered by :: Shawnta Schlegel, RMA   Activities of Daily Living    12/27/2022    8:20 AM 03/13/2022    9:18 AM  In your present state of health, do you have any difficulty performing the following activities:  Hearing? 0 0  Vision? 0 0  Difficulty concentrating or making decisions? 0 0   Walking or climbing stairs? 1 1  Dressing or bathing? 0 0  Doing errands, shopping? 0 0  Preparing Food and eating ? N   Using the Toilet? N   In the past six months, have you accidently leaked urine? N   Do you have problems with loss of bowel control? N   Managing your Medications? N   Managing your Finances? N   Housekeeping or managing your Housekeeping? N     Patient Care Team: Rana Snare, DO as PCP - General Meriam Sprague, MD  as PCP - Cardiology (Cardiology)  Indicate any recent Medical Services you may have received from other than [facility] providers in the past year (date may be approximate).     Assessment:   This is a routine wellness examination for Jamarlon.  Hearing/Vision screen Hearing Screening - Comments:: Denies hearing difficulties   Vision Screening - Comments:: Denies vision issues.   Dietary issues and exercise activities discussed:     Goals Addressed               This Visit's Progress     Patient Stated (pt-stated)        Want to be more healthier.  Joining the gym to work on legs.      Depression Screen    12/27/2022    8:31 AM 03/13/2022    9:18 AM 01/12/2021   10:00 AM 10/27/2020   10:54 AM 12/15/2019    3:36 PM 06/28/2018    3:40 PM 09/28/2017    1:42 PM  PHQ 2/9 Scores  PHQ - 2 Score 0 0 2 0 0 0 0  PHQ- 9 Score 0 2 7        Fall Risk    12/27/2022    8:26 AM 03/13/2022    9:17 AM 01/12/2021    9:30 AM 10/27/2020   10:52 AM 12/15/2019    3:35 PM  Fall Risk   Falls in the past year? 0 0 1 0 0  Number falls in past yr: 0  1    Injury with Fall? 0  0    Comment   JUST SORNESS    Risk for fall due to : No Fall Risks No Fall Risks -- No Fall Risks No Fall Risks  Risk for fall due to: Comment   PATIENT FELL DUE TO LEG PAIN    Follow up Falls prevention discussed;Falls evaluation completed  Falls evaluation completed;Falls prevention discussed Falls prevention discussed Falls prevention discussed    MEDICARE RISK AT  HOME: Medicare Risk at Home Any stairs in or around the home?: No Home free of loose throw rugs in walkways, pet beds, electrical cords, etc?: Yes Adequate lighting in your home to reduce risk of falls?: Yes Life alert?: No Use of a cane, walker or w/c?: Yes (uses cane sometimes) Grab bars in the bathroom?: No Shower chair or bench in shower?: Yes Elevated toilet seat or a handicapped toilet?: Yes  TIMED UP AND GO:  Was the test performed? No    Cognitive Function:        12/27/2022    8:27 AM  6CIT Screen  What Year? 0 points  What month? 0 points  What time? 0 points  Count back from 20 0 points  Months in reverse 4 points  Repeat phrase 2 points  Total Score 6 points    Immunizations Immunization History  Administered Date(s) Administered   Fluad Quad(high Dose 65+) 03/13/2022   PNEUMOCOCCAL CONJUGATE-20 03/10/2022   Tdap 09/23/2019    TDAP status: Up to date  Flu Vaccine status: Due, Education has been provided regarding the importance of this vaccine. Advised may receive this vaccine at local pharmacy or Health Dept. Aware to provide a copy of the vaccination record if obtained from local pharmacy or Health Dept. Verbalized acceptance and understanding.  Pneumococcal vaccine status: Up to date  Covid-19 vaccine status: Completed vaccines  Qualifies for Shingles Vaccine? Yes   Zostavax completed No   Shingrix Completed?: No.    Education has been  provided regarding the importance of this vaccine. Patient has been advised to call insurance company to determine out of pocket expense if they have not yet received this vaccine. Advised may also receive vaccine at local pharmacy or Health Dept. Verbalized acceptance and understanding.  Screening Tests Health Maintenance  Topic Date Due   Zoster Vaccines- Shingrix (1 of 2) Never done   COVID-19 Vaccine (1 - 2023-24 season) Never done   INFLUENZA VACCINE  12/07/2022   Medicare Annual Wellness (AWV)  12/27/2023    Colonoscopy  07/30/2025   DTaP/Tdap/Td (2 - Td or Tdap) 09/22/2029   Pneumonia Vaccine 47+ Years old  Completed   Hepatitis C Screening  Completed   HPV VACCINES  Aged Out    Health Maintenance  Health Maintenance Due  Topic Date Due   Zoster Vaccines- Shingrix (1 of 2) Never done   COVID-19 Vaccine (1 - 2023-24 season) Never done   INFLUENZA VACCINE  12/07/2022    Colorectal cancer screening: Type of screening: Colonoscopy. Completed 07/31/2022. Repeat every 3 years  Lung Cancer Screening: (Low Dose CT Chest recommended if Age 78-80 years, 20 pack-year currently smoking OR have quit w/in 15years.) does not qualify.   Lung Cancer Screening Referral: N/A  Additional Screening:  Hepatitis C Screening: does qualify; Completed 08/12/2016  Vision Screening: Recommended annual ophthalmology exams for early detection of glaucoma and other disorders of the eye. Is the patient up to date with their annual eye exam?  No  Who is the provider or what is the name of the office in which the patient attends annual eye exams? N/A If pt is not established with a provider, would they like to be referred to a provider to establish care? Yes .   Dental Screening: Recommended annual dental exams for proper oral hygiene   Community Resource Referral / Chronic Care Management: CRR required this visit?  No   CCM required this visit?  No    Plan:     I have personally reviewed and noted the following in the patient's chart:   Medical and social history Use of alcohol, tobacco or illicit drugs  Current medications and supplements including opioid prescriptions. Patient is not currently taking opioid prescriptions. Functional ability and status Nutritional status Physical activity Advanced directives List of other physicians Hospitalizations, surgeries, and ER visits in previous 12 months Vitals Screenings to include cognitive, depression, and falls Referrals and appointments  In  addition, I have reviewed and discussed with patient certain preventive protocols, quality metrics, and best practice recommendations. A written personalized care plan for preventive services as well as general preventive health recommendations were provided to patient.     Keveon Amsler L Sybrina Laning, CMA   12/27/2022   After Visit Summary: (MyChart) Due to this being a telephonic visit, the after visit summary with patients personalized plan was offered to patient via MyChart   Nurse Notes: Patient is due for Flu vaccine.  He stated that he will think about getting the Covid and Shingles vaccine.  Patient is also due for a eye exam and would like a recommendation for one.  It has been over at least 2 years or more since he last had his eyes checked.

## 2022-12-27 NOTE — Patient Instructions (Addendum)
Harold Hayes , Thank you for taking time to come for your Medicare Wellness Visit. I appreciate your ongoing commitment to your health goals. Please review the following plan we discussed and let me know if I can assist you in the future.   Referrals/Orders/Follow-Ups/Clinician Recommendations: Remember to ask provider about eye doctor recommendation.  It will soon be time for your Flu vaccine.  Also think about getting that Shingles vaccine.  Keep up the good work.  Each day, aim for 6 glasses of water, plenty of protein in your diet and try to get up and walk/ stretch every hour for 5-10 minutes at a time.    This is a list of the screening recommended for you and due dates:  Health Maintenance  Topic Date Due   Zoster (Shingles) Vaccine (1 of 2) Never done   COVID-19 Vaccine (1 - 2023-24 season) Never done   Flu Shot  12/07/2022   Medicare Annual Wellness Visit  12/27/2023   Colon Cancer Screening  07/30/2025   DTaP/Tdap/Td vaccine (2 - Td or Tdap) 09/22/2029   Pneumonia Vaccine  Completed   Hepatitis C Screening  Completed   HPV Vaccine  Aged Out    Advanced directives: (Copy Requested) Please bring a copy of your health care power of attorney and living will to the office to be added to your chart at your convenience.  Next Medicare Annual Wellness Visit scheduled for next year: Yes

## 2023-01-02 ENCOUNTER — Ambulatory Visit (INDEPENDENT_AMBULATORY_CARE_PROVIDER_SITE_OTHER): Payer: PPO | Admitting: Student

## 2023-01-02 ENCOUNTER — Other Ambulatory Visit (HOSPITAL_COMMUNITY): Payer: Self-pay

## 2023-01-02 ENCOUNTER — Other Ambulatory Visit: Payer: Self-pay

## 2023-01-02 ENCOUNTER — Encounter: Payer: Self-pay | Admitting: Student

## 2023-01-02 DIAGNOSIS — K8689 Other specified diseases of pancreas: Secondary | ICD-10-CM | POA: Diagnosis not present

## 2023-01-02 DIAGNOSIS — Z6836 Body mass index (BMI) 36.0-36.9, adult: Secondary | ICD-10-CM

## 2023-01-02 DIAGNOSIS — E785 Hyperlipidemia, unspecified: Secondary | ICD-10-CM | POA: Diagnosis not present

## 2023-01-02 DIAGNOSIS — I10 Essential (primary) hypertension: Secondary | ICD-10-CM

## 2023-01-02 DIAGNOSIS — R972 Elevated prostate specific antigen [PSA]: Secondary | ICD-10-CM

## 2023-01-02 DIAGNOSIS — Z1211 Encounter for screening for malignant neoplasm of colon: Secondary | ICD-10-CM | POA: Insufficient documentation

## 2023-01-02 DIAGNOSIS — Z87891 Personal history of nicotine dependence: Secondary | ICD-10-CM

## 2023-01-02 DIAGNOSIS — R1900 Intra-abdominal and pelvic swelling, mass and lump, unspecified site: Secondary | ICD-10-CM

## 2023-01-02 MED ORDER — ROSUVASTATIN CALCIUM 20 MG PO TABS
20.0000 mg | ORAL_TABLET | Freq: Every day | ORAL | 11 refills | Status: DC
  Filled 2023-01-02: qty 30, 30d supply, fill #0
  Filled 2023-02-01: qty 30, 30d supply, fill #1
  Filled 2023-03-12: qty 30, 30d supply, fill #2
  Filled 2023-04-10: qty 30, 30d supply, fill #3
  Filled 2023-05-08: qty 30, 30d supply, fill #4
  Filled 2023-06-07: qty 30, 30d supply, fill #5
  Filled 2023-07-07: qty 30, 30d supply, fill #6
  Filled 2023-08-08: qty 30, 30d supply, fill #7
  Filled 2023-09-07: qty 30, 30d supply, fill #8
  Filled 2023-10-05: qty 30, 30d supply, fill #9
  Filled 2023-11-04: qty 30, 30d supply, fill #10
  Filled 2023-11-28: qty 30, 30d supply, fill #11

## 2023-01-02 MED ORDER — AMLODIPINE BESYLATE 5 MG PO TABS
5.0000 mg | ORAL_TABLET | Freq: Every day | ORAL | 11 refills | Status: DC
  Filled 2023-01-02: qty 30, 30d supply, fill #0
  Filled 2023-02-01: qty 30, 30d supply, fill #1
  Filled 2023-03-12: qty 30, 30d supply, fill #2
  Filled 2023-04-10: qty 30, 30d supply, fill #3
  Filled 2023-05-08: qty 30, 30d supply, fill #4
  Filled 2023-06-07: qty 30, 30d supply, fill #5
  Filled 2023-07-07: qty 30, 30d supply, fill #6
  Filled 2023-08-08: qty 30, 30d supply, fill #7
  Filled 2023-09-07: qty 30, 30d supply, fill #8
  Filled 2023-10-05: qty 30, 30d supply, fill #9
  Filled 2023-11-04: qty 30, 30d supply, fill #10
  Filled 2023-11-28: qty 30, 30d supply, fill #11

## 2023-01-02 MED ORDER — HYDROCHLOROTHIAZIDE 25 MG PO TABS
25.0000 mg | ORAL_TABLET | Freq: Every day | ORAL | 11 refills | Status: DC
  Filled 2023-01-02: qty 30, 30d supply, fill #0
  Filled 2023-02-01: qty 30, 30d supply, fill #1
  Filled 2023-03-12: qty 30, 30d supply, fill #2
  Filled 2023-04-10: qty 30, 30d supply, fill #3
  Filled 2023-05-08: qty 30, 30d supply, fill #4
  Filled 2023-06-07: qty 30, 30d supply, fill #5
  Filled 2023-07-07: qty 30, 30d supply, fill #6
  Filled 2023-08-08: qty 30, 30d supply, fill #7
  Filled 2023-09-07: qty 30, 30d supply, fill #8
  Filled 2023-10-05: qty 30, 30d supply, fill #9
  Filled 2023-11-04: qty 30, 30d supply, fill #10
  Filled 2023-11-28: qty 30, 30d supply, fill #11

## 2023-01-02 NOTE — Assessment & Plan Note (Signed)
Colonoscopy 07/31/2022 with 5 polyps 3 to 7 mm in size.  Repeat colonoscopy in 3 years.  They were tubular adenomas.

## 2023-01-02 NOTE — Patient Instructions (Addendum)
Xin, Bosshardt you for allowing me to take part in your care today.  Here are your instructions.  1.  Regarding your blood pressure, continue taking your amlodipine 5 mg daily.  Please continue taking HCTZ 25 mg daily.  2.  Regarding your high cholesterol, I am rechecking your cholesterol today.  Continue taking Crestor 20 mg daily.  3.  Regarding your elevated PSA, I am rechecking a PSA today to evaluate if your PSA is continued to go up.  I have referred you to urology.  Please await phone call for appointment.  4.  I have ordered for you to get an MRI to evaluate for the pancreatic mass.  Please await a call to schedule appointment.  Health risk office at this time.  5.  I have ordered to check kidney function today, with his family today, A1c today to screen for diabetes, and PSA.  I will call you to go over results.   Thank you, Dr. Allena Katz  If you have any other questions please contact the internal medicine clinic at [phone]

## 2023-01-02 NOTE — Assessment & Plan Note (Signed)
At previous visit patient had elevated LDL at 105.  Patient reports compliance with his Crestor 20 mg daily.  Will update lipid panel today.  Plan: -Follow-up lipid panel Continue Crestor, milligrams daily -Patient encouraged to diet and exercise

## 2023-01-02 NOTE — Assessment & Plan Note (Signed)
MRI was ordered at some point, but insurance did not cover at the time.  Given patient did have mass, will need to follow-up with MRI.  Will reorder MRI today.  Patient will need this MRI.  Plan: -MRI abdomen ordered

## 2023-01-02 NOTE — Assessment & Plan Note (Signed)
Patient reports that he is doing well.  He has no concerns.  He reports compliance with his amlodipine, HCTZ,.  Current treatment plan includes amlodipine 5 mg daily and HCTZ 25 mg daily.  Denies any chest pain, shortness of breath, vision changes.  Patient does report he needs refills on his medications.  Plan: -Continue amlodipine 5 mg daily -Continue HCTZ 25 mg daily -Obtain BMP

## 2023-01-02 NOTE — Progress Notes (Signed)
   CC: Follow-up visit  HPI:  Harold Hayes is a 67 y.o. male with a past medical history of hypertension, hyperlipidemia who presents for follow-up appointment.  Please see assessment plan for full HPI.  Meds: Hypertension: Amlodipine 5 mg daily, HCTZ 25 mg daily Hyperlipidemia: Crestor 20 mg daily   Past Medical History:  Diagnosis Date   Hyperlipidemia    Hypertension    Numbness of arm 06/20/2016     Current Outpatient Medications:    amLODipine (NORVASC) 5 MG tablet, Take 1 tablet (5 mg total) by mouth daily., Disp: 30 tablet, Rfl: 11   hydrochlorothiazide (HYDRODIURIL) 25 MG tablet, Take 1 tablet (25 mg total) by mouth daily., Disp: 30 tablet, Rfl: 11   rosuvastatin (CRESTOR) 20 MG tablet, Take 1 tablet (20 mg total) by mouth daily., Disp: 30 tablet, Rfl: 11  Review of Systems:   Patient endorses no concerns.  Physical Exam:  Vitals:   01/02/23 0935 01/02/23 0942  BP: (!) 144/65 133/69  Pulse: 84 78  Temp: 97.6 F (36.4 C)   TempSrc: Oral   SpO2: 99%   Weight: 274 lb 12.8 oz (124.6 kg)   Height: 6\' 1"  (1.854 m)    General: Patient is sitting comfortably in the room  Head: Normocephalic, atraumatic  Cardio: Regular rate and rhythm, no murmurs, rubs or gallops. Pulmonary: Clear to ausculation bilaterally with no rales, rhonchi, and crackles    Assessment & Plan:   Morbid obesity (HCC) - BMI>35 associated with HTN and Hyperlipidemia.  Discussed the importance of weight loss and diet and exercise.  He reports understanding of this.  He does endorse that he does have a membership at the gym.  Provided patient education on the importance of weight loss.  Plan: -Diet handout provided -Encourage patient to exercise  Essential hypertension Patient reports that he is doing well.  He has no concerns.  He reports compliance with his amlodipine, HCTZ,.  Current treatment plan includes amlodipine 5 mg daily and HCTZ 25 mg daily.  Denies any chest pain, shortness  of breath, vision changes.  Patient does report he needs refills on his medications.  Plan: -Continue amlodipine 5 mg daily -Continue HCTZ 25 mg daily -Obtain BMP  Pancreatic mass MRI was ordered at some point, but insurance did not cover at the time.  Given patient did have mass, will need to follow-up with MRI.  Will reorder MRI today.  Patient will need this MRI.  Plan: -MRI abdomen ordered  Hyperlipidemia At previous visit patient had elevated LDL at 105.  Patient reports compliance with his Crestor 20 mg daily.  Will update lipid panel today.  Plan: -Follow-up lipid panel Continue Crestor, milligrams daily -Patient encouraged to diet and exercise  Elevated PSA, between 10 and less than 20 ng/ml Patient's PSA At 11.  Patient not been to urology.  Will refer to urology.  Plan: Repeat PSA today  Screening for colon cancer Colonoscopy 07/31/2022 with 5 polyps 3 to 7 mm in size.  Repeat colonoscopy in 3 years.  They were tubular adenomas.   Patient discussed with Dr. Princella Pellegrini, DO PGY-2 Internal Medicine Resident  Pager: [phone]

## 2023-01-02 NOTE — Assessment & Plan Note (Addendum)
-   BMI>35 associated with HTN and Hyperlipidemia.  Discussed the importance of weight loss and diet and exercise.  He reports understanding of this.  He does endorse that he does have a membership at the gym.  Provided patient education on the importance of weight loss.  Plan: -Diet handout provided -Encourage patient to exercise

## 2023-01-02 NOTE — Assessment & Plan Note (Signed)
Patient's PSA At 11.  Patient not been to urology.  Will refer to urology.  Plan: Repeat PSA today

## 2023-01-03 LAB — BMP8+ANION GAP
Anion Gap: 15 mmol/L (ref 10.0–18.0)
BUN/Creatinine Ratio: 10 (ref 10–24)
BUN: 12 mg/dL (ref 8–27)
CO2: 23 mmol/L (ref 20–29)
Calcium: 9.3 mg/dL (ref 8.6–10.2)
Chloride: 102 mmol/L (ref 96–106)
Creatinine, Ser: 1.26 mg/dL (ref 0.76–1.27)
Glucose: 109 mg/dL — ABNORMAL HIGH (ref 70–99)
Potassium: 3.1 mmol/L — ABNORMAL LOW (ref 3.5–5.2)
Sodium: 140 mmol/L (ref 134–144)
eGFR: 63 mL/min/{1.73_m2} (ref >59)

## 2023-01-03 LAB — LIPID PANEL
Chol/HDL Ratio: 2.7 ratio (ref 0.0–5.0)
Cholesterol, Total: 156 mg/dL (ref 100–199)
HDL: 57 mg/dL (ref >39)
LDL Chol Calc (NIH): 84 mg/dL (ref 0–99)
Triglycerides: 79 mg/dL (ref 0–149)
VLDL Cholesterol Cal: 15 mg/dL (ref 5–40)

## 2023-01-03 LAB — PSA: Prostate Specific Ag, Serum: 10 ng/mL — ABNORMAL HIGH (ref 0.0–4.0)

## 2023-01-03 LAB — HEMOGLOBIN A1C
Est. average glucose Bld gHb Est-mCnc: 148 mg/dL
Hgb A1c MFr Bld: 6.8 % — ABNORMAL HIGH (ref 4.8–5.6)

## 2023-01-16 NOTE — Progress Notes (Signed)
Internal Medicine Clinic Attending  Case discussed with the resident at the time of the visit.  We reviewed the resident's history and exam and pertinent patient test results.  I agree with the assessment, diagnosis, and plan of care documented in the resident's note.  

## 2023-01-29 ENCOUNTER — Other Ambulatory Visit (HOSPITAL_COMMUNITY): Payer: Self-pay

## 2023-01-29 ENCOUNTER — Ambulatory Visit
Admission: EM | Admit: 2023-01-29 | Discharge: 2023-01-29 | Disposition: A | Discharge status: Home / Self Care | Payer: PPO

## 2023-01-29 DIAGNOSIS — J069 Acute upper respiratory infection, unspecified: Secondary | ICD-10-CM

## 2023-01-29 MED ORDER — PROMETHAZINE-DM 6.25-15 MG/5ML PO SYRP
5.0000 mL | ORAL_SOLUTION | Freq: Four times a day (QID) | ORAL | 0 refills | Status: DC | PRN
Start: 2023-01-29 — End: 2023-02-27
  Filled 2023-01-29: qty 118, 6d supply, fill #0

## 2023-01-29 MED ORDER — FLUTICASONE PROPIONATE 50 MCG/ACT NA SUSP
1.0000 | Freq: Every day | NASAL | 2 refills | Status: DC
  Filled 2023-01-29: qty 16, 30d supply, fill #0

## 2023-01-29 MED ORDER — BENZONATATE 100 MG PO CAPS
100.0000 mg | ORAL_CAPSULE | Freq: Three times a day (TID) | ORAL | 0 refills | Status: DC
Start: 2023-01-29 — End: 2023-02-27
  Filled 2023-01-29: qty 21, 7d supply, fill #0

## 2023-01-29 NOTE — Discharge Instructions (Addendum)
Take take Tessalon as needed for cough Can use cough syrup as needed Recommend Flonase once daily Can take Tylenol as needed for headache or fever Recommend rest, drink plenty of fluids If symptoms become worse return for evaluation.

## 2023-01-29 NOTE — ED Triage Notes (Signed)
"  Started this past Wednesday with headache, sorethroat, fatigue & a bad cold". "I have been taking Mucinex with some improvement". No fever known. No sob. Some cough at times. No @ home COVID19 testing.

## 2023-01-29 NOTE — ED Provider Notes (Signed)
[facility]    CSN: [id number] Arrival date & time: 01/29/23  0855      History   Chief Complaint Chief Complaint  Patient presents with   Cough   Sore Throat    HPI Harold Hayes is a 67 y.o. male.   Pt complains of cough, congestion, body aches, headache, fever that started about 6 days ago.  He has had to miss work due to [employer]. He has been taking Mucinex with minimal relief. He has also tried Delsym with minimal relief.  He reports cough is worse at night.  He denies shortness of breath or wheezing, n/v/d.  He reports wife is sick with similar sx.     Past Medical History:  Diagnosis Date   Hyperlipidemia    Hypertension    Numbness of arm 06/20/2016    Patient Active Problem List   Diagnosis Date Noted   Morbid obesity (HCC) 01/02/2023   Screening for colon cancer 01/02/2023   Numbness 07/03/2022   Hyperlipidemia 03/13/2022   Low back pain 12/15/2019   Erectile dysfunction 06/28/2018   Hypokalemia 06/28/2018   Pancreatic mass 05/23/2018   Elevated PSA, between 10 and less than 20 ng/ml 10/12/2017   Urinary frequency 09/28/2017   Chronic cough 09/28/2017   Numbness of arm 06/20/2016   Essential hypertension 06/20/2016   Healthcare maintenance 06/20/2016    Past Surgical History:  Procedure Laterality Date   COLONOSCOPY     SPINE SURGERY         Home Medications    Prior to Admission medications   Medication Sig Start Date End Date Taking? Authorizing Provider  amLODipine (NORVASC) 5 MG tablet Take 1 tablet (5 mg total) by mouth daily. 01/02/23  Yes Modena Slater, DO  benzonatate (TESSALON) 100 MG capsule Take 1 capsule (100 mg total) by mouth every 8 (eight) hours. 01/29/23  Yes Ward, Tylene Fantasia, PA-C  fluticasone (FLONASE) 50 MCG/ACT nasal spray Place 1 spray into both nostrils daily. 01/29/23  Yes Ward, Tylene Fantasia, PA-C  guaiFENesin (MUCINEX PO) Take by mouth.   Yes [provider]  hydrochlorothiazide (HYDRODIURIL) 25 MG tablet  Take 1 tablet (25 mg total) by mouth daily. 01/02/23  Yes Modena Slater, DO  promethazine-dextromethorphan (PROMETHAZINE-DM) 6.25-15 MG/5ML syrup Take 5 mLs by mouth 4 (four) times daily as needed for cough. 01/29/23  Yes Ward, Tylene Fantasia, PA-C  rosuvastatin (CRESTOR) 20 MG tablet Take 1 tablet (20 mg total) by mouth daily. 01/02/23  Yes Modena Slater, DO    Family History Family History  Problem Relation Age of Onset   Diabetes Mother    Diabetes Sister    Diabetes Sister    Hypertension Daughter    Colon cancer Neg Hx    Esophageal cancer Neg Hx    Rectal cancer Neg Hx    Stomach cancer Neg Hx    Colon polyps Neg Hx     Social History Social History   Tobacco Use   Smoking status: Former    Current packs/day: 0.00    Average packs/day: 1.5 packs/day for 20.0 years (30.0 ttl pk-yrs)    Types: Cigarettes    Start date: 05/08/1968    Quit date: 05/08/1988    Years since quitting: 34.7   Smokeless tobacco: Never  Vaping Use   Vaping status: Never Used  Substance Use Topics   Alcohol use: No   Drug use: No    Comment: 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 Negative for fever.  HENT:  Positive for congestion. Negative for ear pain and sore throat.   Eyes:  Negative for pain and visual disturbance.  Respiratory:  Positive for cough. Negative for shortness of breath and wheezing.   Cardiovascular:  Negative for chest pain and palpitations.  Gastrointestinal:  Negative for abdominal pain and vomiting.  Genitourinary:  Negative for dysuria and hematuria.  Musculoskeletal:  Negative for arthralgias and back pain.  Skin:  Negative for color change and rash.  Neurological:  Negative for seizures and syncope.  All other systems reviewed and are negative.    Physical Exam Triage Vital Signs ED Triage Vitals  Encounter Vitals Group     BP 01/29/23  1001 122/72     Systolic BP Percentile --      Diastolic BP Percentile --      Pulse Rate 01/29/23 1001 76     Resp 01/29/23 1001 18     Temp 01/29/23 1001 97.8 F (36.6 C)     Temp Source 01/29/23 1001 Oral     SpO2 01/29/23 1001 97 %     Weight 01/29/23 0959 280 lb (127 kg)     Height 01/29/23 0959 6' (1.829 m)     Head Circumference --      Peak Flow --      Pain Score 01/29/23 0957 0     Pain Loc --      Pain Education --      Exclude from Growth Chart --    No data found.  Updated Vital Signs BP 122/72 (BP Location: Left Arm)   Pulse 76   Temp 97.8 F (36.6 C) (Oral)   Resp 18   Ht 6' (1.829 m)   Wt 280 lb (127 kg)   SpO2 97%   BMI 37.97 kg/m   Visual Acuity Right Eye Distance:   Left Eye Distance:   Bilateral Distance:    Right Eye Near:   Left Eye Near:    Bilateral Near:     Physical Exam Vitals and nursing note reviewed.  Constitutional:      General: He is not in acute distress.    Appearance: He is well-developed.  HENT:     Head: Normocephalic and atraumatic.  Eyes:     Conjunctiva/sclera: Conjunctivae normal.  Cardiovascular:     Rate and Rhythm: Normal rate and regular rhythm.     Heart sounds: No murmur heard. Pulmonary:     Effort: Pulmonary effort is normal. No respiratory distress.     Breath sounds: Normal breath sounds.  Abdominal:     Palpations: Abdomen is soft.     Tenderness: There is no abdominal tenderness.  Musculoskeletal:        General: No swelling.     Cervical back: Neck supple.  Skin:    General: Skin is warm and dry.     Capillary Refill: Capillary refill takes less than 2 seconds.  Neurological:     Mental Status: He is alert.  Psychiatric:        Mood and Affect: Mood normal.      UC Treatments / Results  Labs (all labs ordered are listed, but only abnormal results are displayed) Labs Reviewed - No data to display  EKG   Radiology No results found.  Procedures Procedures (including critical care  time)  Medications Ordered in UC  Medications - No data to display  Initial Impression / Assessment and Plan / UC Course  I have reviewed the triage vital signs and the nursing notes.  Pertinent labs & imaging results that were available during my care of the patient were reviewed by me and considered in my medical decision making (see chart for details).     URI, pt overall well appearing, in no acute distress, lungs clear.  Supportive care discussed.  Return precautions discussed. Work note provided.  Final Clinical Impressions(s) / UC Diagnoses   Final diagnoses:  Acute upper respiratory infection     Discharge Instructions      Take take Tessalon as needed for cough Can use cough syrup as needed Recommend Flonase once daily Can take Tylenol as needed for headache or fever Recommend rest, drink plenty of fluids If symptoms become worse return for evaluation.      ED Prescriptions     Medication Sig Dispense Auth. Provider   benzonatate (TESSALON) 100 MG capsule Take 1 capsule (100 mg total) by mouth every 8 (eight) hours. 21 capsule Ward, Tylene Fantasia, PA-C   promethazine-dextromethorphan (PROMETHAZINE-DM) 6.25-15 MG/5ML syrup Take 5 mLs by mouth 4 (four) times daily as needed for cough. 118 mL Ward, Shanda Bumps Z, PA-C   fluticasone (FLONASE) 50 MCG/ACT nasal spray Place 1 spray into both nostrils daily. 9.9 mL Ward, Tylene Fantasia, PA-C      PDMP not reviewed this encounter.   Ward, Tylene Fantasia, PA-C 01/29/23 1022

## 2023-01-31 ENCOUNTER — Other Ambulatory Visit: Payer: Self-pay | Admitting: Internal Medicine

## 2023-01-31 ENCOUNTER — Ambulatory Visit (HOSPITAL_COMMUNITY)
Admission: RE | Admit: 2023-01-31 | Discharge: 2023-01-31 | Disposition: A | Discharge status: Home / Self Care | Payer: PPO | Source: Ambulatory Visit | Attending: Internal Medicine | Admitting: Internal Medicine

## 2023-01-31 DIAGNOSIS — K8689 Other specified diseases of pancreas: Secondary | ICD-10-CM

## 2023-01-31 DIAGNOSIS — R972 Elevated prostate specific antigen [PSA]: Secondary | ICD-10-CM

## 2023-01-31 DIAGNOSIS — R1900 Intra-abdominal and pelvic swelling, mass and lump, unspecified site: Secondary | ICD-10-CM | POA: Insufficient documentation

## 2023-01-31 DIAGNOSIS — E785 Hyperlipidemia, unspecified: Secondary | ICD-10-CM

## 2023-01-31 DIAGNOSIS — Z1211 Encounter for screening for malignant neoplasm of colon: Secondary | ICD-10-CM

## 2023-01-31 DIAGNOSIS — I10 Essential (primary) hypertension: Secondary | ICD-10-CM

## 2023-01-31 MED ORDER — GADOBUTROL 1 MMOL/ML IV SOLN
10.0000 mL | Freq: Once | INTRAVENOUS | Status: AC | PRN
  Administered 2023-01-31: 10 mL via INTRAVENOUS

## 2023-02-01 ENCOUNTER — Other Ambulatory Visit (HOSPITAL_COMMUNITY): Payer: Self-pay

## 2023-02-08 ENCOUNTER — Telehealth: Payer: Self-pay | Admitting: Student

## 2023-02-08 DIAGNOSIS — K8689 Other specified diseases of pancreas: Secondary | ICD-10-CM

## 2023-02-08 NOTE — Telephone Encounter (Signed)
Called the patient today to relay the findings of his MRI. He voiced understanding. With concerns for growth of the pancreatic mass. Will refer patient for GI to get EUS/ERCP.

## 2023-02-08 NOTE — Telephone Encounter (Signed)
-----   Message from Earl Lagos sent at 02/08/2023  9:46 AM EDT ----- Regarding: FW: Can you call the patient with the results of the MRI and refer him to GI for EUS/ERCP? Would try and schedule this expediently please. Thanks! ----- Message ----- From: Interface, Rad Results In Sent: 02/07/2023   5:03 PM EDT To: Earl Lagos, MD

## 2023-02-12 ENCOUNTER — Telehealth: Payer: Self-pay | Admitting: Gastroenterology

## 2023-02-12 ENCOUNTER — Other Ambulatory Visit: Payer: Self-pay

## 2023-02-12 DIAGNOSIS — K862 Cyst of pancreas: Secondary | ICD-10-CM

## 2023-02-12 NOTE — Telephone Encounter (Signed)
Spoke to the patient over the phone today.  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  I went over the endoscopic ultrasound procedure, and he is amenable to proceeding with getting this scheduled.  I also informed them that we would need a CA 19-9 lab for him to drop off prior to the EUS  Patty or Beth, lets go ahead and get him scheduled for EUS at a time interval that would be good for Dr. Meridee Score.  Also please call the patient and go over how to get him to drop off the CA 19-9 lab and please place order.  Thank you.

## 2023-02-12 NOTE — Telephone Encounter (Signed)
Urgent referral in [facility] for pancreatic mass. MRI report in EPIC.  Please review and advise scheduling and urgency.  Thanks

## 2023-02-12 NOTE — Telephone Encounter (Signed)
This is a patient of Dr. Derek Mound.   With the increased size of the pancreatic cyst, he does meet criteria (just based on size) to consider endoscopic ultrasound and possible FNA.  If he is amenable to that, then I would also recommend a CA 19-9 be drawn and be performed prior to EUS.   If he is apprehensive, then consideration would be for a CA 19-9 to be drawn and as long as it is normal, a repeat MRI/MRCP in 3 to 6 months and then decide whether to pursue EUS at that time. Lets see what the patient decides. Thanks. GM

## 2023-02-12 NOTE — Telephone Encounter (Signed)
Order in for lab. Pt aware and knows he may go to the lab M-F between the hours of 7:30am-5pm, no appt needed.

## 2023-02-13 ENCOUNTER — Other Ambulatory Visit: Payer: Self-pay

## 2023-02-13 ENCOUNTER — Other Ambulatory Visit: Payer: PPO

## 2023-02-13 DIAGNOSIS — K862 Cyst of pancreas: Secondary | ICD-10-CM

## 2023-02-14 LAB — CANCER ANTIGEN 19-9: CA 19-9: 7 U/mL (ref <34)

## 2023-02-14 NOTE — Telephone Encounter (Signed)
EUS scheduled, pt instructed and medications reviewed.  Patient instructions mailed to home.  Patient to call with any questions or concerns.  

## 2023-02-14 NOTE — Telephone Encounter (Signed)
EUS has been set up for 04/23/23 at 1 pm at [facility] with GM   Left message on machine to call back

## 2023-02-22 ENCOUNTER — Other Ambulatory Visit: Payer: Self-pay

## 2023-02-22 ENCOUNTER — Other Ambulatory Visit (HOSPITAL_COMMUNITY): Payer: Self-pay

## 2023-02-22 ENCOUNTER — Ambulatory Visit: Payer: PPO | Admitting: Student

## 2023-02-22 ENCOUNTER — Encounter: Payer: Self-pay | Admitting: Student

## 2023-02-22 VITALS — BP 108/63 | HR 91 | Temp 97.5°F | Ht 73.0 in | Wt 273.5 lb

## 2023-02-22 DIAGNOSIS — I10 Essential (primary) hypertension: Secondary | ICD-10-CM | POA: Diagnosis not present

## 2023-02-22 DIAGNOSIS — R34 Anuria and oliguria: Secondary | ICD-10-CM | POA: Diagnosis not present

## 2023-02-22 DIAGNOSIS — R338 Other retention of urine: Secondary | ICD-10-CM

## 2023-02-22 MED ORDER — TAMSULOSIN HCL 0.4 MG PO CAPS
0.4000 mg | ORAL_CAPSULE | Freq: Every day | ORAL | 6 refills | Status: DC
  Filled 2023-02-22: qty 30, 30d supply, fill #0

## 2023-02-22 NOTE — Patient Instructions (Addendum)
Thank you, [name] for allowing [place] to provide your care today. Today we discussed:  Urinary retention - We will send you to follow up with Urology, with an appointment on Monday - Please empty the foley bag as it gets full - START tamsulosin 0.4 mg daily  - If any complication arises from now till Monday, please go to ED.   I have ordered the following labs for you:  Lab Orders         PSA         BMP8+Anion Gap         Urinalysis, Reflex Microscopic      Tests ordered today:  None   Referrals ordered today:   Referral Orders  No referral(s) requested today     I have ordered the following medication/changed the following medications:   Stop the following medications: There are no discontinued medications.   Start the following medications: Meds ordered this encounter  Medications   tamsulosin (FLOMAX) 0.4 MG CAPS capsule    Sig: Take 1 capsule (0.4 mg total) by mouth daily.    Dispense:  30 capsule    Refill:  6     Follow up: 2 months    Remember: Routine follow up   Should you have any questions or concerns please call the internal medicine clinic at [phone].     Jeral Pinch, DO [facility]

## 2023-02-22 NOTE — Progress Notes (Signed)
Established Patient Office Visit  Subjective   Patient ID: Harold Hayes, male    DOB: [birth year]  Age: 67 y.o. MRN: [id number]  Chief Complaint  Patient presents with   Follow-up    Unable to void x 3 days    HPI  This is a 67 year old male living with a history stated below and presents today for acute urinary retension. Please see problem based assessment and plan for additional details.    Patient Active Problem List   Diagnosis Date Noted   Acute urinary retention 02/23/2023   Morbid obesity (HCC) 01/02/2023   Screening for colon cancer 01/02/2023   Numbness 07/03/2022   Hyperlipidemia 03/13/2022   Low back pain 12/15/2019   Erectile dysfunction 06/28/2018   Pancreatic mass 05/23/2018   Elevated PSA, between 10 and less than 20 ng/ml 10/12/2017   Chronic cough 09/28/2017   Numbness of arm 06/20/2016   Essential hypertension 06/20/2016   Healthcare maintenance 06/20/2016   Past Medical History:  Diagnosis Date   Hyperlipidemia    Hypertension    Numbness of arm 06/20/2016   Past Surgical History:  Procedure Laterality Date   COLONOSCOPY     SPINE SURGERY     Social History   Socioeconomic History   Marital status: Married    Spouse name: Shabra   Number of children: 4   Years of education: Not on file   Highest education level: Not on file  Occupational History   Occupation: working full time  Tobacco Use   Smoking status: Former    Current packs/day: 0.00    Average packs/day: 1.5 packs/day for 20.0 years (30.0 ttl pk-yrs)    Types: Cigarettes    Start date: 05/08/1968    Quit date: 05/08/1988    Years since quitting: 34.8   Smokeless tobacco: Never  Vaping Use   Vaping status: Never Used  Substance and Sexual Activity   Alcohol use: No   Drug use: No    Comment: 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   Sexual activity: Yes    Birth control/protection: None  Other Topics Concern   Not on file  Social History Narrative   Not  on file   Social Determinants of Health   Financial Resource Strain: High Risk (12/27/2022)   Overall Financial Resource Strain (CARDIA)    Difficulty of Paying Living Expenses: Hard  Food Insecurity: No Food Insecurity (12/27/2022)   Hunger Vital Sign    Worried About Running Out of Food in the Last Year: Never true    Ran Out of Food in the Last Year: Never true  Transportation Needs: No Transportation Needs (12/27/2022)   PRAPARE - Administrator, Civil Service (Medical): No    Lack of Transportation (Non-Medical): No  Physical Activity: Insufficiently Active (12/27/2022)   Exercise Vital Sign    Days of Exercise per Week: 5 days    Minutes of Exercise per Session: 20 min  Stress: No Stress Concern Present (12/27/2022)   [employer] of Occupational Health - Occupational Stress Questionnaire    Feeling of Stress : Not at all  Social Connections: Moderately Integrated (12/27/2022)   Social Connection and Isolation Panel [NHANES]    Frequency of Communication with Friends and Family: Three times a week    Frequency of Social Gatherings with Friends and Family: Three times a week    Attends Religious Services: More than 4 times per year    Active Member  of Clubs or Organizations: No    Attends Banker Meetings: Never    Marital Status: Married  Catering manager Violence: Not At Risk (12/27/2022)   Humiliation, Afraid, Rape, and Kick questionnaire    Fear of Current or Ex-Partner: No    Emotionally Abused: No    Physically Abused: No    Sexually Abused: No   Family Status  Relation Name Status   Mother  Alive   Father  Deceased   Sister  Alive   Sister  Alive   Sister  Alive   Sister  Alive   Sister  Alive   Sister  Alive   Sister  Alive   Brother  Alive   Brother  Alive   MGM  Deceased   MGF  Deceased   PGM  Deceased   PGF  Deceased   Daughter  Alive   Neg Hx  (Not Specified)  No partnership data on file   Family History  Problem  Relation Age of Onset   Diabetes Mother    Diabetes Sister    Diabetes Sister    Hypertension Daughter    Colon cancer Neg Hx    Esophageal cancer Neg Hx    Rectal cancer Neg Hx    Stomach cancer Neg Hx    Colon polyps Neg Hx    Allergies  Allergen Reactions   Lisinopril Cough and Shortness Of Breath    Review of Systems  Constitutional:  Negative for chills and fever.  Respiratory:  Negative for shortness of breath.   Cardiovascular:  Negative for chest pain.  Gastrointestinal:  Negative for abdominal pain.  Genitourinary:  Positive for dysuria and urgency (Urinary retention since Monday, with increase straining and urgency). Negative for flank pain and hematuria.      Objective:     BP 108/63 (BP Location: Right Arm, Patient Position: Sitting, Cuff Size: Large)   Pulse 91   Temp (!) 97.5 F (36.4 C) (Oral)   Ht 6\' 1"  (1.854 m)   Wt 273 lb 8 oz (124.1 kg)   SpO2 100%   BMI 36.08 kg/m  BP Readings from Last 3 Encounters:  02/22/23 108/63  01/29/23 122/72  01/02/23 133/69   Wt Readings from Last 3 Encounters:  02/22/23 273 lb 8 oz (124.1 kg)  01/29/23 280 lb (127 kg)  01/02/23 274 lb 12.8 oz (124.6 kg)   SpO2 Readings from Last 3 Encounters:  02/22/23 100%  01/29/23 97%  01/02/23 99%    Physical Exam  Constitutional: sitting in chair, in no acute distress Cardiovascular: regular rate and rhythm, no m/r/g Pulmonary/Chest: normal work of breathing on room air Abdominal: soft, non-tender, non-distended, bowel sounds present, no suprapubic tenderness  Skin: warm and dry  Results for orders placed or performed in visit on 02/22/23  Microscopic Examination   Urine  Result Value Ref Range   WBC, UA WILL FOLLOW    RBC, Urine WILL FOLLOW    Epithelial Cells (non renal) WILL FOLLOW    Renal Epithel, UA WILL FOLLOW    Casts WILL FOLLOW    Cast Type WILL FOLLOW    Crystals WILL FOLLOW    Crystal Type WILL FOLLOW    Mucus, UA WILL FOLLOW    Bacteria, UA  WILL FOLLOW    Yeast, UA WILL FOLLOW    Trichomonas, UA WILL FOLLOW    Urinalysis Comments WILL FOLLOW   PSA  Result Value Ref Range   Prostate Specific Ag, Serum 44.7 (  H) 0.0 - 4.0 ng/mL  BMP8+Anion Gap  Result Value Ref Range   Glucose 108 (H) 70 - 99 mg/dL   BUN 14 8 - 27 mg/dL   Creatinine, Ser 9.62 0.76 - 1.27 mg/dL   eGFR 71 >95 MW/UXL/2.44   BUN/Creatinine Ratio 12 10 - 24   Sodium 142 134 - 144 mmol/L   Potassium 3.7 3.5 - 5.2 mmol/L   Chloride 103 96 - 106 mmol/L   CO2 24 20 - 29 mmol/L   Anion Gap 15.0 10.0 - 18.0 mmol/L   Calcium 8.9 8.6 - 10.2 mg/dL  Urinalysis, Reflex Microscopic  Result Value Ref Range   Specific Gravity, UA 1.022 1.005 - 1.030   pH, UA 6.5 5.0 - 7.5   Color, UA Yellow Yellow   Appearance Ur Clear Clear   Leukocytes,UA 1+ (A) Negative   Protein,UA 1+ (A) Negative/Trace   Glucose, UA Negative Negative   Ketones, UA Negative Negative   RBC, UA Negative Negative   Bilirubin, UA Negative Negative   Urobilinogen, Ur 1.0 0.2 - 1.0 mg/dL   Nitrite, UA Negative Negative   Microscopic Examination See below:     Last CBC Lab Results  Component Value Date   WBC 7.3 12/12/2019   HGB 12.9 (L) 12/12/2019   HCT 41.7 12/12/2019   MCV 84.4 12/12/2019   MCH 26.1 12/12/2019   RDW 14.9 12/12/2019   PLT 295 12/12/2019   Last metabolic panel Lab Results  Component Value Date   GLUCOSE 108 (H) 02/22/2023   NA 142 02/22/2023   K 3.7 02/22/2023   CL 103 02/22/2023   CO2 24 02/22/2023   BUN 14 02/22/2023   CREATININE 1.14 02/22/2023   EGFR 71 02/22/2023   CALCIUM 8.9 02/22/2023   PROT 7.5 04/16/2020   ALBUMIN 4.4 04/16/2020   BILITOT 0.3 04/16/2020   ALKPHOS 81 04/16/2020   AST 27 04/16/2020   ALT 44 04/16/2020   ANIONGAP 13 12/12/2019   Last lipids Lab Results  Component Value Date   CHOL 156 01/02/2023   HDL 57 01/02/2023   LDLCALC 84 01/02/2023   TRIG 79 01/02/2023   CHOLHDL 2.7 01/02/2023   Last hemoglobin A1c Lab Results   Component Value Date   HGBA1C 6.8 (H) 01/02/2023      The 10-year ASCVD risk score (Arnett DK, et al., 2019) is: 11.1%    Assessment & Plan:   Problem List Items Addressed This Visit       Cardiovascular and Mediastinum   Essential hypertension (Chronic)    BP Readings from Last 3 Encounters:  02/22/23 108/63  01/29/23 122/72  01/02/23 133/69   atient is currently on amlodipine 5 mg daily and HCTZ 25 mg daily.  Patient denies any chest pain, shortness of breath, vision changes, headaches, lightheadedness or dizziness.  BMP was done at the previous visit.  Plan: -Continue amlodipine 5 mg daily and HCTZ 25 mg daily        Genitourinary   Acute urinary retention    This is a pleasant 67 year old male who presents today with acute urinary retention.  Patient reports started on Monday, he was unable to fully completely empty his bladder.  Patient reports since then, he has been having multiple of are just throughout the day, but he is unable to completely empty his bladder.  This is the first time this is happening to him, no previous episodes.  Patient denies any fever or chills.  Patient does note pain with straining.  Of note, patient did have elevated PSA reading in the past.  03/13/2022, PSA reading of 11.0; PSA rechecked on 03/04/2023 showed 10.0.  Therefore there is a concern for BPH versus malignancy.  Bladder scan in office showed, 505, 240, 237, 613, 355, 515, 554, 359.  Patient reported the last time he voided was at 1:30 PM.  Plan: -Foley catheter in place, initially drained 300 cc, with continuous drainage. -Patient had previous referral in place for urology, he never reached out to them.  An appointment with urology was placed for your upcoming Monday at the [facility] location. -Patient was started on Flomax 0.5 mg daily -Patient was advised to go to ED if anything acute happens between now to Monday -Will recheck BMP and PSA      Other Visit Diagnoses      Anuria and oliguria    -  Primary   Relevant Orders   PSA (Completed)   BMP8+Anion Gap (Completed)   Urinalysis, Reflex Microscopic (Completed)       Return in about 2 months (around 04/24/2023) for Routine follow up .    Jeral Pinch, DO

## 2023-02-23 ENCOUNTER — Other Ambulatory Visit (HOSPITAL_COMMUNITY): Payer: Self-pay

## 2023-02-23 ENCOUNTER — Other Ambulatory Visit: Payer: Self-pay | Admitting: Student

## 2023-02-23 DIAGNOSIS — R338 Other retention of urine: Secondary | ICD-10-CM | POA: Insufficient documentation

## 2023-02-23 LAB — URINALYSIS, ROUTINE W REFLEX MICROSCOPIC
Bilirubin, UA: NEGATIVE
Glucose, UA: NEGATIVE
Ketones, UA: NEGATIVE
Nitrite, UA: NEGATIVE
RBC, UA: NEGATIVE
Specific Gravity, UA: 1.022 (ref 1.005–1.030)
Urobilinogen, Ur: 1 mg/dL (ref 0.2–1.0)
pH, UA: 6.5 (ref 5.0–7.5)

## 2023-02-23 LAB — BMP8+ANION GAP
Anion Gap: 15 mmol/L (ref 10.0–18.0)
BUN/Creatinine Ratio: 12 (ref 10–24)
BUN: 14 mg/dL (ref 8–27)
CO2: 24 mmol/L (ref 20–29)
Calcium: 8.9 mg/dL (ref 8.6–10.2)
Chloride: 103 mmol/L (ref 96–106)
Creatinine, Ser: 1.14 mg/dL (ref 0.76–1.27)
Glucose: 108 mg/dL — ABNORMAL HIGH (ref 70–99)
Potassium: 3.7 mmol/L (ref 3.5–5.2)
Sodium: 142 mmol/L (ref 134–144)
eGFR: 71 mL/min/{1.73_m2} (ref >59)

## 2023-02-23 LAB — MICROSCOPIC EXAMINATION
Casts: NONE SEEN /[LPF]
Epithelial Cells (non renal): NONE SEEN /[HPF] (ref 0–10)
RBC, Urine: NONE SEEN /[HPF] (ref 0–2)

## 2023-02-23 LAB — PSA: Prostate Specific Ag, Serum: 44.7 ng/mL — ABNORMAL HIGH (ref 0.0–4.0)

## 2023-02-23 MED ORDER — SULFAMETHOXAZOLE-TRIMETHOPRIM 800-160 MG PO TABS
1.0000 | ORAL_TABLET | Freq: Two times a day (BID) | ORAL | 0 refills | Status: AC
  Filled 2023-02-23: qty 14, 7d supply, fill #0

## 2023-02-23 NOTE — Progress Notes (Signed)
Internal Medicine Clinic Attending  I was physically present during the key portions of the resident provided service and participated in the medical decision making of patient's management care. I reviewed pertinent patient test results.  The assessment, diagnosis, and plan were formulated together and I agree with the documentation in the resident's note.  Patient presented with two days of acute urine retention, and had symptoms of lower abdominal discomfort and some systemic symptoms as well. We placed a foley catheter pretty easily, he is having continuous drainage, had about instantly, bag was full by the time he got home, this morning he drained another 1.5L from overnight. He is feeling better today. Urinalysis with pyuria and some bacteruria, will treat with antibiotic for cystitis which is likely from the retention rather than a cause of it. PSA was drawn before we placed the foley, and is more elevated at 44. I warned that this urine retention could be due to a new prostate cancer, and he is going to consult with Urology office on Monday. He started flomax. Hopefully they can do a voiding trial Monday and arrange for prostate biopsy.   Erlinda Hong, MD FACP

## 2023-02-23 NOTE — Assessment & Plan Note (Addendum)
This is a pleasant 67 year old male who presents today with acute urinary retention.  Patient reports started on Monday, he was unable to fully completely empty his bladder.  Patient reports since then, he has been having multiple of are just throughout the day, but he is unable to completely empty his bladder.  This is the first time this is happening to him, no previous episodes.  Patient denies any fever or chills.  Patient does note pain with straining.  Of note, patient did have elevated PSA reading in the past.  03/13/2022, PSA reading of 11.0; PSA rechecked on 03/04/2023 showed 10.0.  Therefore there is a concern for BPH versus malignancy.  Bladder scan in office showed, 505, 240, 237, 613, 355, 515, 554, 359.  Patient reported the last time he voided was at 1:30 PM.  Plan: -Foley catheter in place, initially drained 300 cc, with continuous drainage. -Patient had previous referral in place for urology, he never reached out to them.  An appointment with urology was placed for your upcoming Monday at the [facility] location. -Patient was started on Flomax 0.5 mg daily -Patient was advised to go to ED if anything acute happens between now to Monday -Will recheck BMP and PSA, UA

## 2023-02-23 NOTE — Assessment & Plan Note (Signed)
BP Readings from Last 3 Encounters:  02/22/23 108/63  01/29/23 122/72  01/02/23 133/69   atient is currently on amlodipine 5 mg daily and HCTZ 25 mg daily.  Patient denies any chest pain, shortness of breath, vision changes, headaches, lightheadedness or dizziness.  BMP was done at the previous visit.  Plan: -Continue amlodipine 5 mg daily and HCTZ 25 mg daily

## 2023-02-26 ENCOUNTER — Ambulatory Visit: Payer: PPO | Admitting: Urology

## 2023-02-26 NOTE — Progress Notes (Deleted)
   Assessment: 1. Elevated PSA   2. Urinary retention     Plan: ***  Chief Complaint: No chief complaint on file.   History of Present Illness:  Harold Hayes is a 67 y.o. male who is seen in consultation from Rana Snare, DO for evaluation of ***.   Past Medical History:  Past Medical History:  Diagnosis Date   Hyperlipidemia    Hypertension    Numbness of arm 06/20/2016    Past Surgical History:  Past Surgical History:  Procedure Laterality Date   COLONOSCOPY     SPINE SURGERY      Allergies:  Allergies  Allergen Reactions   Lisinopril Cough and Shortness Of Breath    Family History:  Family History  Problem Relation Age of Onset   Diabetes Mother    Diabetes Sister    Diabetes Sister    Hypertension Daughter    Colon cancer Neg Hx    Esophageal cancer Neg Hx    Rectal cancer Neg Hx    Stomach cancer Neg Hx    Colon polyps Neg Hx     Social History:  Social History   Tobacco Use   Smoking status: Former    Current packs/day: 0.00    Average packs/day: 1.5 packs/day for 20.0 years (30.0 ttl pk-yrs)    Types: Cigarettes    Start date: 05/08/1968    Quit date: 05/08/1988    Years since quitting: 34.8   Smokeless tobacco: Never  Vaping Use   Vaping status: Never Used  Substance Use Topics   Alcohol use: No   Drug use: No    Comment: 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    Review of symptoms:  Constitutional:  Negative for unexplained weight loss, night sweats, fever, chills ENT:  Negative for nose bleeds, sinus pain, painful swallowing CV:  Negative for chest pain, shortness of breath, exercise intolerance, palpitations, loss of consciousness Resp:  Negative for cough, wheezing, shortness of breath GI:  Negative for nausea, vomiting, diarrhea, bloody stools GU:  Positives noted in HPI; otherwise negative for gross hematuria, dysuria, urinary incontinence Neuro:  Negative for seizures, poor balance, limb weakness, slurred  speech Psych:  Negative for lack of energy, depression, anxiety Endocrine:  Negative for polydipsia, polyuria, symptoms of hypoglycemia (dizziness, hunger, sweating) Hematologic:  Negative for anemia, purpura, petechia, prolonged or excessive bleeding, use of anticoagulants  Allergic:  Negative for difficulty breathing or choking as a result of exposure to anything; no shellfish allergy; no allergic response (rash/itch) to materials, foods  Physical exam: There were no vitals taken for this visit. GENERAL APPEARANCE:  Well appearing, well developed, well nourished, NAD HEENT: Atraumatic, Normocephalic, oropharynx clear. NECK: Supple without lymphadenopathy or thyromegaly. LUNGS: Clear to auscultation bilaterally. HEART: Regular Rate and Rhythm without murmurs, gallops, or rubs. ABDOMEN: Soft, non-tender, No Masses. EXTREMITIES: Moves all extremities well.  Without clubbing, cyanosis, or edema. NEUROLOGIC:  Alert and oriented x 3, normal gait, CN II-XII grossly intact.  MENTAL STATUS:  Appropriate. BACK:  Non-tender to palpation.  No CVAT SKIN:  Warm, dry and intact.    Results: No results found for this or any previous visit (from the past 24 hour(s)).

## 2023-02-27 ENCOUNTER — Ambulatory Visit: Payer: PPO | Admitting: Urology

## 2023-02-27 ENCOUNTER — Encounter: Payer: Self-pay | Admitting: Urology

## 2023-02-27 VITALS — BP 168/82 | HR 106 | Ht 72.0 in | Wt 273.0 lb

## 2023-02-27 DIAGNOSIS — N138 Other obstructive and reflux uropathy: Secondary | ICD-10-CM | POA: Insufficient documentation

## 2023-02-27 DIAGNOSIS — R972 Elevated prostate specific antigen [PSA]: Secondary | ICD-10-CM

## 2023-02-27 DIAGNOSIS — N401 Enlarged prostate with lower urinary tract symptoms: Secondary | ICD-10-CM | POA: Diagnosis not present

## 2023-02-27 DIAGNOSIS — R339 Retention of urine, unspecified: Secondary | ICD-10-CM

## 2023-02-27 NOTE — Progress Notes (Signed)
Fill and Pull Catheter Removal  Patient is present today for a catheter removal.  Patient was cleaned and prepped in a sterile fashion of sterile water/ saline was instilled into the bladder when the patient felt the urge to urinate, 10ml of water was then drained from the balloon.  A foley cath was removed from the bladder no complications were noted .  Patient as then given some time to void on their own.  Patient can void  on their own after some time.  Patient tolerated well.  Performed by: Idelle Crouch., CMA(AAMA)

## 2023-02-27 NOTE — Progress Notes (Signed)
Assessment: 1. Urinary retention   2. Elevated PSA   3. BPH with obstruction/lower urinary tract symptoms     Plan: I personally reviewed the patient's chart including provider notes, and lab results. Foley catheter removed today after successful voiding trial. Continue tamsulosin 0.4 mg daily. Complete Bactrim. Return to office in 1 week with bladder scan. Patient advised to return to the office later today if he is unable to void.  Today I had a long discussion with the patient regarding PSA and the rationale and controversies of prostate cancer early detection.  I discussed the pros and cons of further evaluation including TRUS and prostate Bx.  Potential adverse events and complications as well as standard instructions were given.  Patient expressed his understanding of these issues. Will arrangements for further evaluation with a prostate ultrasound and biopsy after his next visit.   Chief Complaint:  Chief Complaint  Patient presents with   Urinary Retention    History of Present Illness:  Harold Hayes is a 67 y.o. male who is seen in consultation from Rana Snare, DO for evaluation of urinary retention and elevated PSA. He presented to his PCP on 02/22/2023 with acute urinary retention.  He reported several days of difficulty voiding.  He reported increased frequency, urgency, nocturia, and hesitancy.  No prior episodes of retention.  Bladder scan demonstrated around 500 mL.  A Foley catheter was placed which initially drained 300 mL.  He was started on tamsulosin and Bactrim.   His Foley has been draining well.  No gross hematuria.  He has a history of elevated PSA. PSA results: 2/20 6.5 11/23 11.0 8/24 10.0 10/24 44.7  No prior prostate biopsy.  No family history of prostate cancer.   Past Medical History:  Past Medical History:  Diagnosis Date   Hyperlipidemia    Hypertension    Numbness of arm 06/20/2016    Past Surgical History:  Past Surgical  History:  Procedure Laterality Date   COLONOSCOPY     SPINE SURGERY      Allergies:  Allergies  Allergen Reactions   Lisinopril Cough and Shortness Of Breath    Family History:  Family History  Problem Relation Age of Onset   Diabetes Mother    Diabetes Sister    Diabetes Sister    Hypertension Daughter    Colon cancer Neg Hx    Esophageal cancer Neg Hx    Rectal cancer Neg Hx    Stomach cancer Neg Hx    Colon polyps Neg Hx     Social History:  Social History   Tobacco Use   Smoking status: Former    Current packs/day: 0.00    Average packs/day: 1.5 packs/day for 20.0 years (30.0 ttl pk-yrs)    Types: Cigarettes    Start date: 05/08/1968    Quit date: 05/08/1988    Years since quitting: 34.8   Smokeless tobacco: Never  Vaping Use   Vaping status: Never Used  Substance Use Topics   Alcohol use: No   Drug use: No    Comment: 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    Review of symptoms:  Constitutional:  Negative for unexplained weight loss, night sweats, fever, chills ENT:  Negative for nose bleeds, sinus pain, painful swallowing CV:  Negative for chest pain, shortness of breath, exercise intolerance, palpitations, loss of consciousness Resp:  Negative for cough, wheezing, shortness of breath GI:  Negative for nausea, vomiting, diarrhea, bloody stools GU:  Positives noted in HPI; otherwise negative for gross hematuria, dysuria, urinary incontinence Neuro:  Negative for seizures, poor balance, limb weakness, slurred speech Psych:  Negative for lack of energy, depression, anxiety Endocrine:  Negative for polydipsia, polyuria, symptoms of hypoglycemia (dizziness, hunger, sweating) Hematologic:  Negative for anemia, purpura, petechia, prolonged or excessive bleeding, use of anticoagulants  Allergic:  Negative for difficulty breathing or choking as a result of exposure to anything; no shellfish allergy; no allergic response (rash/itch) to materials,  foods  Physical exam: BP (!) 168/82   Pulse (!) 106   Ht 6' (1.829 m)   Wt 273 lb (123.8 kg)   BMI 37.03 kg/m  GENERAL APPEARANCE:  Well appearing, well developed, well nourished, NAD HEENT: Atraumatic, Normocephalic, oropharynx clear. NECK: Supple without lymphadenopathy or thyromegaly. LUNGS: Clear to auscultation bilaterally. HEART: Regular Rate and Rhythm without murmurs, gallops, or rubs. ABDOMEN: Soft, non-tender, No Masses. EXTREMITIES: Moves all extremities well.  Without clubbing, cyanosis, or edema. NEUROLOGIC:  Alert and oriented x 3, normal gait, CN II-XII grossly intact.  MENTAL STATUS:  Appropriate. BACK:  Non-tender to palpation.  No CVAT SKIN:  Warm, dry and intact.   GU: Penis:  uncircumcised Meatus: Normal Scrotum: normal, no masses Testis: normal without masses bilateral Epididymis: normal Prostate: 50 g, NT, difficult to feel base Rectum: Normal tone,  no masses or tenderness   Results: None  Procedure:  VOIDING TRIAL  A voiding trial was performed in the office today.   Volume of sterile water instilled: 100 mL Foley catheter removed intact. Volume voided by patient: 100 mL Instructed to return to office if has not voided by 4 PM

## 2023-03-06 ENCOUNTER — Ambulatory Visit: Payer: PPO | Admitting: Urology

## 2023-03-06 ENCOUNTER — Encounter: Payer: Self-pay | Admitting: Urology

## 2023-03-06 VITALS — BP 170/90 | HR 99 | Ht 73.0 in | Wt 273.0 lb

## 2023-03-06 DIAGNOSIS — N401 Enlarged prostate with lower urinary tract symptoms: Secondary | ICD-10-CM

## 2023-03-06 DIAGNOSIS — N138 Other obstructive and reflux uropathy: Secondary | ICD-10-CM | POA: Diagnosis not present

## 2023-03-06 DIAGNOSIS — R972 Elevated prostate specific antigen [PSA]: Secondary | ICD-10-CM | POA: Diagnosis not present

## 2023-03-06 DIAGNOSIS — Z87898 Personal history of other specified conditions: Secondary | ICD-10-CM

## 2023-03-06 DIAGNOSIS — R338 Other retention of urine: Secondary | ICD-10-CM

## 2023-03-06 LAB — URINALYSIS, ROUTINE W REFLEX MICROSCOPIC
Bilirubin, UA: NEGATIVE
Glucose, UA: NEGATIVE
Ketones, UA: NEGATIVE
Leukocytes,UA: NEGATIVE
Nitrite, UA: NEGATIVE
Protein,UA: NEGATIVE
RBC, UA: NEGATIVE
Specific Gravity, UA: 1.02 (ref 1.005–1.030)
Urobilinogen, Ur: 0.2 mg/dL (ref 0.2–1.0)
pH, UA: 6 (ref 5.0–7.5)

## 2023-03-06 LAB — BLADDER SCAN AMB NON-IMAGING

## 2023-03-06 MED ORDER — TAMSULOSIN HCL 0.4 MG PO CAPS
0.4000 mg | ORAL_CAPSULE | Freq: Two times a day (BID) | ORAL | 11 refills | Status: DC
Start: 2023-03-06 — End: 2023-11-28

## 2023-03-06 NOTE — Progress Notes (Signed)
Assessment: 1. Elevated PSA   2. History of urinary retention   3. BPH with obstruction/lower urinary tract symptoms     Plan: Increase tamsulosin to 0.4 mg twice daily.  Today I had a long discussion with the patient regarding PSA and the rationale and controversies of prostate cancer early detection.  I discussed the pros and cons of further evaluation including TRUS and prostate Bx.  Potential adverse events and complications as well as standard instructions were given.  Patient expressed his understanding of these issues. I discussed further evaluation with a prostate biopsy as well as a prostate MRI.  He would like to begin with evaluation with a prostate MRI. 3T mp MRI of prostate ordered.  Will contact him with results. Return to office in 1 month  Chief Complaint:  Chief Complaint  Patient presents with   Urinary Retention    History of Present Illness:  Harold Hayes is a 67 y.o. male who is seen for further evaluation of urinary retention and elevated PSA. He presented to his PCP on 02/22/2023 with acute urinary retention.  He reported several days of difficulty voiding.  He reported increased frequency, urgency, nocturia, and hesitancy.  No prior episodes of retention.  Bladder scan demonstrated around 500 mL.  A Foley catheter was placed which initially drained 300 mL.  He was started on tamsulosin and Bactrim.    He has a history of elevated PSA. PSA results: 2/20 6.5 11/23 11.0 8/24 10.0 10/24 44.7  No prior prostate biopsy.  No family history of prostate cancer. DRE 02/27/23:  50 g gland, difficult to feel base, NT His Foley was removed on 02/28/2023 after a successful voiding trial.  He continued on tamsulosin and completed a course of Bactrim.  He returns today for follow-up.  He has been voiding spontaneously since the catheter was removed.  He continues with some intermittent stream and decreased stream.  He also has frequency, urgency, and nocturia.  He  has some slight dysuria which is improving.  No gross hematuria.  He continues on tamsulosin 0.4 mg daily.  Portions of the above documentation were copied from a prior visit for review purposes only.  Past Medical History:  Past Medical History:  Diagnosis Date   Hyperlipidemia    Hypertension    Numbness of arm 06/20/2016    Past Surgical History:  Past Surgical History:  Procedure Laterality Date   COLONOSCOPY     SPINE SURGERY      Allergies:  Allergies  Allergen Reactions   Lisinopril Cough and Shortness Of Breath    Family History:  Family History  Problem Relation Age of Onset   Diabetes Mother    Diabetes Sister    Diabetes Sister    Hypertension Daughter    Colon cancer Neg Hx    Esophageal cancer Neg Hx    Rectal cancer Neg Hx    Stomach cancer Neg Hx    Colon polyps Neg Hx     Social History:  Social History   Tobacco Use   Smoking status: Former    Current packs/day: 0.00    Average packs/day: 1.5 packs/day for 20.0 years (30.0 ttl pk-yrs)    Types: Cigarettes    Start date: 05/08/1968    Quit date: 05/08/1988    Years since quitting: 34.8   Smokeless tobacco: Never  Vaping Use   Vaping status: Never Used  Substance Use Topics   Alcohol use: No   Drug use: No  Comment: 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    ROS: Constitutional:  Negative for fever, chills, weight loss CV: Negative for chest pain, previous MI, hypertension Respiratory:  Negative for shortness of breath, wheezing, sleep apnea, frequent cough GI:  Negative for nausea, vomiting, bloody stool, GERD  Physical exam: BP (!) 170/90   Pulse 99   Ht 6\' 1"  (1.854 m)   Wt 273 lb (123.8 kg)   BMI 36.02 kg/m  GENERAL APPEARANCE:  Well appearing, well developed, well nourished, NAD HEENT:  Atraumatic, normocephalic, oropharynx clear NECK:  Supple without lymphadenopathy or thyromegaly ABDOMEN:  Soft, non-tender, no masses EXTREMITIES:  Moves all extremities well,  without clubbing, cyanosis, or edema NEUROLOGIC:  Alert and oriented x 3, normal gait, CN II-XII grossly intact MENTAL STATUS:  appropriate BACK:  Non-tender to palpation, No CVAT SKIN:  Warm, dry, and intact   Results: U/A: negative  PVR: 94 ml

## 2023-03-12 ENCOUNTER — Other Ambulatory Visit: Payer: Self-pay | Admitting: Student

## 2023-03-12 ENCOUNTER — Other Ambulatory Visit (HOSPITAL_COMMUNITY): Payer: Self-pay

## 2023-03-12 DIAGNOSIS — R338 Other retention of urine: Secondary | ICD-10-CM

## 2023-03-13 ENCOUNTER — Other Ambulatory Visit (HOSPITAL_COMMUNITY): Payer: Self-pay

## 2023-03-19 ENCOUNTER — Other Ambulatory Visit: Payer: Self-pay | Admitting: Student

## 2023-03-19 ENCOUNTER — Other Ambulatory Visit (HOSPITAL_COMMUNITY): Payer: Self-pay

## 2023-03-19 DIAGNOSIS — R338 Other retention of urine: Secondary | ICD-10-CM

## 2023-03-21 ENCOUNTER — Other Ambulatory Visit (HOSPITAL_COMMUNITY): Payer: Self-pay

## 2023-03-21 ENCOUNTER — Telehealth: Payer: Self-pay | Admitting: Student

## 2023-03-21 NOTE — Telephone Encounter (Signed)
Called and informed pt Flomax rx was sent to [facility] on [facility] by Dr Pete Glatter on 10/29. Pt stated he did not realized it was sent to this pharmacy; stated he will go to [facility]. Also informed pt there's a [facility] on his chart; pt asking to have it taken off his chart - Done.

## 2023-03-21 NOTE — Telephone Encounter (Signed)
tamsulosin (FLOMAX) 0.4 MG CAPS capsule   [id number] - [place] ([place]),  - [address]

## 2023-03-23 ENCOUNTER — Other Ambulatory Visit (HOSPITAL_COMMUNITY): Payer: Self-pay

## 2023-04-15 ENCOUNTER — Ambulatory Visit
Admission: RE | Admit: 2023-04-15 | Discharge: 2023-04-15 | Disposition: A | Discharge status: Home / Self Care | Payer: PPO | Source: Ambulatory Visit | Attending: Urology | Admitting: Urology

## 2023-04-15 DIAGNOSIS — R972 Elevated prostate specific antigen [PSA]: Secondary | ICD-10-CM

## 2023-04-15 MED ORDER — GADOPICLENOL 0.5 MMOL/ML IV SOLN
10.0000 mL | Freq: Once | INTRAVENOUS | Status: AC | PRN
  Administered 2023-04-15: 10 mL via INTRAVENOUS

## 2023-04-17 ENCOUNTER — Encounter (HOSPITAL_COMMUNITY): Payer: Self-pay | Admitting: Gastroenterology

## 2023-04-23 ENCOUNTER — Encounter (HOSPITAL_COMMUNITY)
Admission: RE | Disposition: A | Discharge status: Home / Self Care | Payer: Self-pay | Source: Home / Self Care | Attending: Gastroenterology

## 2023-04-23 ENCOUNTER — Ambulatory Visit (HOSPITAL_COMMUNITY): Payer: PPO

## 2023-04-23 ENCOUNTER — Other Ambulatory Visit: Payer: Self-pay

## 2023-04-23 ENCOUNTER — Ambulatory Visit (HOSPITAL_BASED_OUTPATIENT_CLINIC_OR_DEPARTMENT_OTHER): Payer: PPO

## 2023-04-23 ENCOUNTER — Encounter (HOSPITAL_COMMUNITY): Payer: Self-pay | Admitting: Gastroenterology

## 2023-04-23 ENCOUNTER — Other Ambulatory Visit (HOSPITAL_COMMUNITY): Payer: Self-pay

## 2023-04-23 ENCOUNTER — Ambulatory Visit (HOSPITAL_COMMUNITY)
Admission: RE | Admit: 2023-04-23 | Discharge: 2023-04-23 | Disposition: A | Discharge status: Home / Self Care | Payer: PPO | Attending: Gastroenterology | Admitting: Gastroenterology

## 2023-04-23 DIAGNOSIS — K2970 Gastritis, unspecified, without bleeding: Secondary | ICD-10-CM

## 2023-04-23 DIAGNOSIS — K209 Esophagitis, unspecified without bleeding: Secondary | ICD-10-CM | POA: Insufficient documentation

## 2023-04-23 DIAGNOSIS — Z5986 Financial insecurity: Secondary | ICD-10-CM | POA: Diagnosis not present

## 2023-04-23 DIAGNOSIS — K222 Esophageal obstruction: Secondary | ICD-10-CM | POA: Diagnosis not present

## 2023-04-23 DIAGNOSIS — K449 Diaphragmatic hernia without obstruction or gangrene: Secondary | ICD-10-CM | POA: Insufficient documentation

## 2023-04-23 DIAGNOSIS — K2990 Gastroduodenitis, unspecified, without bleeding: Secondary | ICD-10-CM | POA: Diagnosis not present

## 2023-04-23 DIAGNOSIS — K862 Cyst of pancreas: Secondary | ICD-10-CM | POA: Diagnosis present

## 2023-04-23 DIAGNOSIS — I10 Essential (primary) hypertension: Secondary | ICD-10-CM | POA: Insufficient documentation

## 2023-04-23 DIAGNOSIS — Z87891 Personal history of nicotine dependence: Secondary | ICD-10-CM | POA: Insufficient documentation

## 2023-04-23 DIAGNOSIS — K259 Gastric ulcer, unspecified as acute or chronic, without hemorrhage or perforation: Secondary | ICD-10-CM | POA: Diagnosis not present

## 2023-04-23 DIAGNOSIS — Z8249 Family history of ischemic heart disease and other diseases of the circulatory system: Secondary | ICD-10-CM | POA: Insufficient documentation

## 2023-04-23 DIAGNOSIS — K5710 Diverticulosis of small intestine without perforation or abscess without bleeding: Secondary | ICD-10-CM | POA: Diagnosis not present

## 2023-04-23 HISTORY — PX: FINE NEEDLE ASPIRATION: SHX5430

## 2023-04-23 HISTORY — PX: BIOPSY: SHX5522

## 2023-04-23 HISTORY — PX: ESOPHAGOGASTRODUODENOSCOPY (EGD) WITH PROPOFOL: SHX5813

## 2023-04-23 HISTORY — PX: EUS: SHX5427

## 2023-04-23 SURGERY — UPPER ENDOSCOPIC ULTRASOUND (EUS) RADIAL
Anesthesia: Monitor Anesthesia Care

## 2023-04-23 MED ORDER — CIPROFLOXACIN IN D5W 400 MG/200ML IV SOLN
INTRAVENOUS | Status: DC | PRN
  Administered 2023-04-23: 400 mg via INTRAVENOUS

## 2023-04-23 MED ORDER — PANTOPRAZOLE SODIUM 40 MG PO TBEC
40.0000 mg | DELAYED_RELEASE_TABLET | Freq: Two times a day (BID) | ORAL | 12 refills | Status: AC
  Filled 2023-04-23: qty 60, 30d supply, fill #0
  Filled 2023-05-23: qty 60, 30d supply, fill #1
  Filled 2023-06-07 – 2023-06-18 (×2): qty 60, 30d supply, fill #2
  Filled 2023-09-07: qty 60, 30d supply, fill #3
  Filled 2023-10-05: qty 60, 30d supply, fill #4
  Filled 2023-11-28: qty 60, 30d supply, fill #5
  Filled 2024-01-08: qty 60, 30d supply, fill #6
  Filled 2024-02-04: qty 60, 30d supply, fill #7
  Filled 2024-03-05: qty 60, 30d supply, fill #8

## 2023-04-23 MED ORDER — PHENYLEPHRINE HCL-NACL 20-0.9 MG/250ML-% IV SOLN
INTRAVENOUS | Status: DC | PRN
  Administered 2023-04-23: 30 ug/min via INTRAVENOUS

## 2023-04-23 MED ORDER — CIPROFLOXACIN IN D5W 400 MG/200ML IV SOLN
INTRAVENOUS | Status: AC
  Filled 2023-04-23: qty 200

## 2023-04-23 MED ORDER — SODIUM CHLORIDE 0.9 % IV SOLN
INTRAVENOUS | Status: DC | PRN
Start: 2023-04-23 — End: 2023-04-23

## 2023-04-23 MED ORDER — PROPOFOL 500 MG/50ML IV EMUL
INTRAVENOUS | Status: DC | PRN
  Administered 2023-04-23: 125 ug/kg/min via INTRAVENOUS

## 2023-04-23 MED ORDER — PROPOFOL 1000 MG/100ML IV EMUL
INTRAVENOUS | Status: AC
  Filled 2023-04-23: qty 100

## 2023-04-23 MED ORDER — CIPROFLOXACIN HCL 500 MG PO TABS: 500.0000 mg | ORAL_TABLET | Freq: Two times a day (BID) | ORAL | 0 refills | Status: AC

## 2023-04-23 MED ORDER — PROPOFOL 10 MG/ML IV BOLUS
INTRAVENOUS | Status: DC | PRN
Start: 2023-04-23 — End: 2023-04-23
  Administered 2023-04-23 (×2): 50 mg via INTRAVENOUS
  Administered 2023-04-23: 100 mg via INTRAVENOUS
  Administered 2023-04-23: 50 mg via INTRAVENOUS

## 2023-04-23 NOTE — Op Note (Signed)
[facility] Patient Name: Harold Hayes Procedure Date: 04/23/2023 MRN: [id number] Attending MD: Corliss Parish , MD, [id number] Date of Birth: 04/02/1956 CSN: [id number] Age: 67 Admit Type: Outpatient Procedure:                Upper EUS Indications:              Pancreatic cyst on MRCP Providers:                Corliss Parish, MD, Fransisca Connors, Harrington Challenger, Technician, Caprice Red, CRNA Referring MD:              Medicines:                Monitored Anesthesia Care Complications:            No immediate complications. Estimated Blood Loss:     Estimated blood loss was minimal. Procedure:                Pre-Anesthesia Assessment:                           - Prior to the procedure, a History and Physical                            was performed, and patient medications and                            allergies were reviewed. The patient's tolerance of                            previous anesthesia was also reviewed. The risks                            and benefits of the procedure and the sedation                            options and risks were discussed with the patient.                            All questions were answered, and informed consent                            was obtained. Prior Anticoagulants: The patient has                            taken no anticoagulant or antiplatelet agents. ASA                            Grade Assessment: II - A patient with mild systemic                            disease. After reviewing the risks and benefits,  the patient was deemed in satisfactory condition to                            undergo the procedure.                           After obtaining informed consent, the endoscope was                            passed under direct vision. Throughout the                            procedure, the patient's blood pressure, pulse, and                             oxygen saturations were monitored continuously. The                            [id number]-H190 ([id number]) Olympus endoscope was introduced                            through the mouth, and advanced to the second part                            of duodenum. The [id number] ([id number]) Olympus linear                            ultrasound scope was introduced through the mouth,                            and advanced to the duodenum for ultrasound                            examination from the stomach and duodenum. The                            upper EUS was accomplished without difficulty. The                            patient tolerated the procedure. Scope In: Scope Out: Findings:      ENDOSCOPIC FINDING: :      No gross lesions were noted in the proximal esophagus and in the mid       esophagus.      LA Grade B (one or more mucosal breaks greater than 5 mm, not extending       between the tops of two mucosal folds) esophagitis with no bleeding was       found in the distal esophagus.      A non-obstructing Schatzki ring was found at the gastroesophageal       junction.      A 2 cm hiatal hernia was present.      One non-bleeding superficial gastric ulcer with a clean ulcer base       (Forrest Class III) was found in the gastric antrum. The lesion was 8 mm  in largest dimension.      Striped mild inflammation characterized by erythema was found in the       gastric antrum.      No other gross lesions were noted in the entire examined stomach.       Biopsies were taken with a cold forceps for histology and Helicobacter       pylori testing.      No gross lesions were noted in the duodenal bulb, in the first portion       of the duodenum and in the second portion of the duodenum.      A medium diverticulum was found in the area of the papilla.      The major papilla was normal otherwise.      ENDOSONOGRAPHIC FINDING: :      An anechoic lesion suggestive of a cyst was identified in the  pancreatic       head/uncinate process of the pancreas. It is not in obvious       communication with the pancreatic duct. The lesion measured 30 mm by 25       mm in maximal cross-sectional diameter. There was a single compartment       thinly septated. The outer wall of the lesion was thin. There was no       associated mass. There was no internal debris within the fluid-filled       cavity. Diagnostic needle aspiration for fluid was performed. Color       Doppler imaging was utilized prior to needle puncture to confirm a lack       of significant vascular structures within the needle path. One pass was       made with the [facility] Scientific expect 22 gauge needle using a       transduodenal approach. A stylet was used. The amount of fluid collected       was 8 mL. The fluid was clear, white and watery. Sample(s) were sent for       chemistry, amylase concentration, histology and CEA.      There was no sign of significant endosonographic abnormality in the       pancreatic head (PD = 1.7 mm), genu of the pancreas (PD = 1.1 mm),       pancreatic body (PD = 1.0 mm) and pancreatic tail (PD = 0.8 mm). No       masses noted, the pancreatic duct was thin in caliber.      There was no sign of significant endosonographic abnormality in the       common bile duct (3.7 mm) and in the common hepatic duct (6.5 mm). An       unremarkable gallbladder and ducts with regular contour were identified.      Endosonographic imaging of the ampulla showed no intramural       (subepithelial) lesion.      Endosonographic imaging in the visualized portion of the liver showed no       mass.      No malignant-appearing lymph nodes were visualized in the celiac region       (level 20), peripancreatic region and porta hepatis region.      The celiac region was visualized. Impression:               EGD impression:                           -  No gross lesions in the proximal esophagus and in                             the mid esophagus.                           - LA Grade B esophagitis with no bleeding found                            distally.                           - Non-obstructing Schatzki ring.                           - 2 cm hiatal hernia.                           - Non-bleeding gastric ulcer with a clean ulcer                            base (Forrest Class III) in the antrum.                           - Antral gastritis. No other gross lesions in the                            entire stomach. Biopsied.                           - No gross lesions in the duodenal bulb, in the                            first portion of the duodenum and in the second                            portion of the duodenum.                           - Duodenal diverticulum at the area of the major                            papilla. Otherwise a normal major papilla.                           EUS impression:                           - A cystic lesion was seen in the pancreatic                            head/uncinate process of the pancreas. Cytology                            results are pending. 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 Fine needle aspiration                            for fluid performed.                           - There was no sign of significant pathology in the                            pancreatic head, genu of the pancreas, pancreatic                            body and pancreatic tail.                           - There was no sign of significant pathology in the                            common bile duct and in the common hepatic duct.                           - No malignant-appearing lymph nodes were                            visualized in the celiac region (level 20),                            peripancreatic region and porta hepatis region. Moderate Sedation:       Not Applicable - Patient had care per Anesthesia. Recommendation:           - The patient will be observed post-procedure,                            until all discharge criteria are met.                           - Discharge patient to home.                           - Patient has a contact number available for                            emergencies. The signs and symptoms of potential                            delayed complications were discussed with the                            patient. Return to normal activities tomorrow.  Written discharge instructions were provided to the                            patient.                           - Low fat diet for 1 week.                           - Observe patient's clinical course.                           - Await cytology results and await path results.                           - Start PPI 40 mg twice daily.                           - Ciprofloxacin 500 mg twice daily x 3 days to                            decrease risk of post interventional infection.                           - Continue present medications otherwise.                           - Recommend repeat upper endoscopy in 3 to 4 months                            to ensure healing of gastric ulcer.                           - Follow-up surveillance to be dictated based on                            the results of the pancreatic fluid analysis.                           - The findings and recommendations were discussed                            with the patient.                           - The findings and recommendations were discussed                            with the patient's family. Procedure Code(s):        --- Professional ---                           [phone], 59, Esophagogastroduodenoscopy, flexible,                            transoral;  with transendoscopic ultrasound-guided                            intramural or transmural fine  needle                            aspiration/biopsy(s), (includes endoscopic                            ultrasound examination limited to the esophagus,                            stomach or duodenum, and adjacent structures)                           43239, 59, Esophagogastroduodenoscopy, flexible,                            transoral; with biopsy, single or multiple Diagnosis Code(s):        --- Professional ---                           K20.90, Esophagitis, unspecified without bleeding                           K22.2, Esophageal obstruction                           K44.9, Diaphragmatic hernia without obstruction or                            gangrene                           K25.9, Gastric ulcer, unspecified as acute or                            chronic, without hemorrhage or perforation                           K29.70, Gastritis, unspecified, without bleeding                           K86.2, Cyst of pancreas                           I89.9, Noninfective disorder of lymphatic vessels                            and lymph nodes, unspecified                           K57.10, Diverticulosis of small intestine without                            perforation or abscess without bleeding CPT copyright 2022 American Medical Association. All rights reserved. The codes documented in this report are preliminary and upon coder  review may  be revised to meet current compliance requirements. Corliss Parish, MD 04/23/2023 11:16:59 AM Number of Addenda: 0

## 2023-04-23 NOTE — Discharge Instructions (Signed)
YOU HAD AN ENDOSCOPIC PROCEDURE TODAY: Refer to the procedure report and other information in the discharge instructions given to you for any specific questions about what was found during the examination. If this information does not answer your questions, please call [place] office at [phone] to clarify.   YOU SHOULD EXPECT: Some feelings of bloating in the abdomen. Passage of more gas than usual. Walking can help get rid of the air that was put into your GI tract during the procedure and reduce the bloating. If you had a lower endoscopy (such as a colonoscopy or flexible sigmoidoscopy) you may notice spotting of blood in your stool or on the toilet paper. Some abdominal soreness may be present for a day or two, also.  DIET: Your first meal following the procedure should be a light meal and then it is ok to progress to your normal diet. A half-sandwich or bowl of soup is an example of a good first meal. Heavy or fried foods are harder to digest and may make you feel nauseous or bloated. Drink plenty of fluids but you should avoid alcoholic beverages for 24 hours. If you had a esophageal dilation, please see attached instructions for diet.    ACTIVITY: Your care partner should take you home directly after the procedure. You should plan to take it easy, moving slowly for the rest of the day. You can resume normal activity the day after the procedure however YOU SHOULD NOT DRIVE, use power tools, machinery or perform tasks that involve climbing or major physical exertion for 24 hours (because of the sedation medicines used during the test).   SYMPTOMS TO REPORT IMMEDIATELY: A gastroenterologist can be reached at any hour. Please call [phone]  for any of the following symptoms:  Following upper endoscopy (EGD, EUS, ERCP, esophageal dilation) Vomiting of blood or coffee ground material  New, significant abdominal pain  New, significant chest pain or pain under the shoulder blades  Painful or  persistently difficult swallowing  New shortness of breath  Black, tarry-looking or red, bloody stools  FOLLOW UP:  If any biopsies were taken you will be contacted by phone or by letter within the next 1-3 weeks. Call [phone]  if you have not heard about the biopsies in 3 weeks.  Please also call with any specific questions about appointments or follow up tests.

## 2023-04-23 NOTE — Anesthesia Preprocedure Evaluation (Addendum)
Anesthesia Evaluation  Patient identified by MRN, date of birth, ID band Patient awake    Reviewed: Allergy & Precautions, NPO status , Patient's Chart, lab work & pertinent test results  Airway Mallampati: II  TM Distance: >3 FB Neck ROM: Full    Dental no notable dental hx.    Pulmonary former smoker   Pulmonary exam normal        Cardiovascular hypertension, Pt. on medications  Rhythm:Regular Rate:Normal     Neuro/Psych negative neurological ROS  negative psych ROS   GI/Hepatic Neg liver ROS,,,Pancreatic cyst   Endo/Other  negative endocrine ROS    Renal/GU negative Renal ROS  negative genitourinary   Musculoskeletal negative musculoskeletal ROS (+)    Abdominal Normal abdominal exam  (+)   Peds  Hematology negative hematology ROS (+)   Anesthesia Other Findings   Reproductive/Obstetrics                              Anesthesia Physical Anesthesia Plan  ASA: 2  Anesthesia Plan: MAC   Post-op Pain Management:    Induction: Intravenous  PONV Risk Score and Plan: 1 and Propofol infusion and Treatment may vary due to age or medical condition  Airway Management Planned: Simple Face Mask and Nasal Cannula  Additional Equipment: None  Intra-op Plan:   Post-operative Plan:   Informed Consent: I have reviewed the patients History and Physical, chart, labs and discussed the procedure including the risks, benefits and alternatives for the proposed anesthesia with the patient or authorized representative who has indicated his/her understanding and acceptance.     Dental advisory given  Plan Discussed with:   Anesthesia Plan Comments:         Anesthesia Quick Evaluation

## 2023-04-23 NOTE — Transfer of Care (Signed)
Immediate Anesthesia Transfer of Care Note  Patient: Harold Hayes  Procedure(s) Performed: UPPER ENDOSCOPIC ULTRASOUND (EUS) RADIAL ESOPHAGOGASTRODUODENOSCOPY (EGD) WITH PROPOFOL BIOPSY FINE NEEDLE ASPIRATION (FNA) LINEAR  Patient Location: PACU and Endoscopy Unit  Anesthesia Type:MAC  Level of Consciousness: drowsy and patient cooperative  Airway & Oxygen Therapy: Patient Spontanous Breathing and Patient connected to nasal cannula oxygen  Post-op Assessment: Report given to RN and Post -op Vital signs reviewed and stable  Post vital signs: Reviewed and stable  Last Vitals:  Vitals Value Taken Time  BP 145/88 04/23/23 1104  Temp 36.1 C 04/23/23 1104  Pulse 86 04/23/23 1107  Resp 17 04/23/23 1107  SpO2 98 % 04/23/23 1107  Vitals shown include unfiled device data.  Last Pain:  Vitals:   04/23/23 1104  TempSrc: Temporal  PainSc: 0-No pain         Complications: No notable events documented.

## 2023-04-23 NOTE — Anesthesia Postprocedure Evaluation (Signed)
Anesthesia Post Note  Patient: LAYNE BEHRINGER  Procedure(s) Performed: UPPER ENDOSCOPIC ULTRASOUND (EUS) RADIAL ESOPHAGOGASTRODUODENOSCOPY (EGD) WITH PROPOFOL BIOPSY FINE NEEDLE ASPIRATION (FNA) LINEAR     Patient location during evaluation: PACU Anesthesia Type: MAC Level of consciousness: awake and alert Pain management: pain level controlled Vital Signs Assessment: post-procedure vital signs reviewed and stable Respiratory status: spontaneous breathing, nonlabored ventilation, respiratory function stable and patient connected to nasal cannula oxygen Cardiovascular status: stable and blood pressure returned to baseline Postop Assessment: no apparent nausea or vomiting Anesthetic complications: no   No notable events documented.  Last Vitals:  Vitals:   04/23/23 1110 04/23/23 1120  BP: 124/72 129/76  Pulse: 78 74  Resp: 16 15  Temp:    SpO2: 97% 97%    Last Pain:  Vitals:   04/23/23 1120  TempSrc:   PainSc: 0-No pain                 Earl Lites P Tyja Gortney

## 2023-04-23 NOTE — H&P (Signed)
GASTROENTEROLOGY PROCEDURE H&P NOTE   Primary Care Physician: Harold Snare, DO  HPI: Harold Hayes is a 67 y.o. 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  Past Medical History:  Diagnosis Date   Hyperlipidemia    Hypertension    Numbness of arm 06/20/2016   Past Surgical History:  Procedure Laterality Date   COLONOSCOPY     SPINE SURGERY     No current facility-administered medications for this encounter.   No current facility-administered medications for this encounter. Allergies  Allergen Reactions   Lisinopril Cough and Shortness Of Breath   Family History  Problem Relation Age of Onset   Diabetes Mother    Diabetes Sister    Diabetes Sister    Hypertension Daughter    Colon cancer Neg Hx    Esophageal cancer Neg Hx    Rectal cancer Neg Hx    Stomach cancer Neg Hx    Colon polyps Neg Hx    Social History   Socioeconomic History   Marital status: Married    Spouse name: Harold Hayes   Number of children: 4   Years of education: Not on file   Highest education level: Not on file  Occupational History   Occupation: working full time  Tobacco Use   Smoking status: Former    Current packs/day: 0.00    Average packs/day: 1.5 packs/day for 20.0 years (30.0 ttl pk-yrs)    Types: Cigarettes    Start date: 05/08/1968    Quit date: 05/08/1988    Years since quitting: 34.9   Smokeless tobacco: Never  Vaping Use   Vaping status: Never Used  Substance and Sexual Activity   Alcohol use: No   Drug use: No    Comment: 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   Sexual activity: Yes    Birth control/protection: None  Other Topics Concern   Not on file  Social History Narrative   Not on file   Social Drivers of Health   Financial Resource Strain: High Risk (12/27/2022)   Overall Financial Resource Strain (CARDIA)    Difficulty of Paying Living Expenses: Hard  Food Insecurity: No Food Insecurity  (12/27/2022)   Hunger Vital Sign    Worried About Running Out of Food in the Last Year: Never true    Ran Out of Food in the Last Year: Never true  Transportation Needs: No Transportation Needs (12/27/2022)   PRAPARE - Administrator, Civil Service (Medical): No    Lack of Transportation (Non-Medical): No  Physical Activity: Insufficiently Active (12/27/2022)   Exercise Vital Sign    Days of Exercise per Week: 5 days    Minutes of Exercise per Session: 20 min  Stress: No Stress Concern Present (12/27/2022)   [employer] of Occupational Health - Occupational Stress Questionnaire    Feeling of Stress : Not at all  Social Connections: Moderately Integrated (12/27/2022)   Social Connection and Isolation Panel [NHANES]    Frequency of Communication with Friends and Family: Three times a week    Frequency of Social Gatherings with Friends and Family: Three times a week    Attends Religious Services: More than 4 times per year    Active Member of Clubs or Organizations: No    Attends Banker Meetings: Never    Marital Status: Married  Catering manager Violence: Not At Risk (12/27/2022)   Humiliation, Afraid, Rape, and Kick questionnaire  Fear of Current or Ex-Partner: No    Emotionally Abused: No    Physically Abused: No    Sexually Abused: No    Physical Exam: Today's Vitals   04/17/23 1157 04/23/23 0823  BP:  (!) 155/78  Pulse:  73  Resp:  14  Temp:  (!) 97.4 F (36.3 C)  TempSrc:  Temporal  SpO2:  100%  Weight: 124.7 kg 124.7 kg  Height:  6\' 1"  (1.854 m)  PainSc:  0-No pain   Body mass index is 36.28 kg/m. GEN: NAD EYE: Sclerae anicteric ENT: MMM CV: Non-tachycardic GI: Soft, NT/ND NEURO:  Alert & Oriented x 3  Lab Results: No results for input(s): "WBC", "HGB", "HCT", "PLT" in the last 72 hours. BMET No results for input(s): "NA", "K", "CL", "CO2", "GLUCOSE", "BUN", "CREATININE", "CALCIUM" in the last 72 hours. LFT No results for  input(s): "PROT", "ALBUMIN", "AST", "ALT", "ALKPHOS", "BILITOT", "BILIDIR", "IBILI" in the last 72 hours. PT/INR No results for input(s): "LABPROT", "INR" in the last 72 hours.   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  The risks of an EUS including intestinal perforation, bleeding, infection, aspiration, and medication effects were discussed as was the possibility it may not give a definitive diagnosis if a biopsy is performed.  When a biopsy of the pancreas is done as part of the EUS, there is an additional risk of pancreatitis at the rate of about 1-2%.  It was explained that procedure related pancreatitis is typically mild, although it can be severe and even life threatening, which is why we do not perform random pancreatic biopsies and only biopsy a lesion/area we feel is concerning enough to warrant the risk.  The risks and benefits of endoscopic evaluation/treatment were discussed with the patient and/or family; these include but are not limited to the risk of perforation, infection, bleeding, missed lesions, lack of diagnosis, severe illness requiring hospitalization, as well as anesthesia and sedation related illnesses.  The patient's history has been reviewed, patient examined, no change in status, and deemed stable for procedure.  The patient and/or family is agreeable to proceed.    Corliss Parish, MD [facility] Office # [phone]

## 2023-04-23 NOTE — Anesthesia Procedure Notes (Addendum)
Procedure Name: MAC Date/Time: 04/23/2023 10:09 AM  Performed by: Maurene Capes, CRNAPre-anesthesia Checklist: Patient identified, Emergency Drugs available, Suction available, Patient being monitored and Timeout performed Patient Re-evaluated:Patient Re-evaluated prior to induction Oxygen Delivery Method: Nasal cannula Preoxygenation: Pre-oxygenation with 100% oxygen Induction Type: IV induction Placement Confirmation: positive ETCO2 Dental Injury: Teeth and Oropharynx as per pre-operative assessment

## 2023-04-24 ENCOUNTER — Encounter: Payer: Self-pay | Admitting: Gastroenterology

## 2023-04-24 LAB — CYTOLOGY - NON PAP

## 2023-04-24 LAB — SURGICAL PATHOLOGY

## 2023-04-26 ENCOUNTER — Encounter (HOSPITAL_COMMUNITY): Payer: Self-pay | Admitting: Gastroenterology

## 2023-05-04 ENCOUNTER — Telehealth: Payer: Self-pay | Admitting: Gastroenterology

## 2023-05-04 NOTE — Telephone Encounter (Signed)
Clydie Braun from [employer] regarding EUS requested a call back at [phone].

## 2023-05-07 ENCOUNTER — Ambulatory Visit: Payer: PPO | Admitting: Urology

## 2023-05-07 ENCOUNTER — Encounter: Payer: Self-pay | Admitting: Urology

## 2023-05-07 NOTE — Progress Notes (Deleted)
Assessment: 1. BPH with obstruction/lower urinary tract symptoms   2. Elevated PSA   3. History of urinary retention      Plan: Continue tamsulosin 0.4 mg twice daily. Free and total PSA today  Chief Complaint:  No chief complaint on file.   History of Present Illness:  Harold Hayes is a 67 y.o. male who is seen for further evaluation of urinary retention and elevated PSA. He presented to his PCP on 02/22/2023 with acute urinary retention.  He reported several days of difficulty voiding.  He reported increased frequency, urgency, nocturia, and hesitancy.  No prior episodes of retention.  Bladder scan demonstrated around 500 mL.  A Foley catheter was placed which initially drained 300 mL.  He was started on tamsulosin and Bactrim.    He has a history of elevated PSA. PSA results: 2/20 6.5 11/23 11.0 8/24 10.0 10/24 44.7  No prior prostate biopsy.  No family history of prostate cancer. DRE 02/27/23:  50 g gland, difficult to feel base, NT His Foley was removed on 02/28/2023 after a successful voiding trial.  He continued on tamsulosin and completed a course of Bactrim. He was voiding spontaneously after the catheter was removed.  He continued with some intermittent stream and decreased stream.  He also had frequency, urgency, and nocturia.  No gross hematuria.  He continued on tamsulosin 0.4 mg daily. PVR = 94 ml. His dose of tamsulosin was increased to 0.4 mg twice daily.  MRI of the prostate from 04/15/2023 showed a prostate volume of 120 cm, no focal lesion of intermediate or higher suspicion for prostate cancer identified.  Portions of the above documentation were copied from a prior visit for review purposes only.  Past Medical History:  Past Medical History:  Diagnosis Date   Hyperlipidemia    Hypertension    Numbness of arm 06/20/2016    Past Surgical History:  Past Surgical History:  Procedure Laterality Date   BIOPSY  04/23/2023   Procedure: BIOPSY;   Surgeon: Lemar Lofty., MD;  Location: [facility];  Service: Gastroenterology;;   COLONOSCOPY     ESOPHAGOGASTRODUODENOSCOPY (EGD) WITH PROPOFOL N/A 04/23/2023   Procedure: ESOPHAGOGASTRODUODENOSCOPY (EGD) WITH PROPOFOL;  Surgeon: Lemar Lofty., MD;  Location: [facility];  Service: Gastroenterology;  Laterality: N/A;   EUS N/A 04/23/2023   Procedure: UPPER ENDOSCOPIC ULTRASOUND (EUS) RADIAL;  Surgeon: Lemar Lofty., MD;  Location: [facility];  Service: Gastroenterology;  Laterality: N/A;   FINE NEEDLE ASPIRATION N/A 04/23/2023   Procedure: FINE NEEDLE ASPIRATION (FNA) LINEAR;  Surgeon: Lemar Lofty., MD;  Location: [facility];  Service: Gastroenterology;  Laterality: N/A;   SPINE SURGERY      Allergies:  Allergies  Allergen Reactions   Lisinopril Cough and Shortness Of Breath    Family History:  Family History  Problem Relation Age of Onset   Diabetes Mother    Diabetes Sister    Diabetes Sister    Hypertension Daughter    Colon cancer Neg Hx    Esophageal cancer Neg Hx    Rectal cancer Neg Hx    Stomach cancer Neg Hx    Colon polyps Neg Hx     Social History:  Social History   Tobacco Use   Smoking status: Former    Current packs/day: 0.00    Average packs/day: 1.5 packs/day for 20.0 years (30.0 ttl pk-yrs)    Types: Cigarettes    Start date: 05/08/1968    Quit date: 05/08/1988  Years since quitting: 35.0   Smokeless tobacco: Never  Vaping Use   Vaping status: Never Used  Substance Use Topics   Alcohol use: No   Drug use: No    Comment: 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 GENERAL APPEARANCE:  Well appearing, well developed, well nourished, NAD HEENT:  Atraumatic, normocephalic, oropharynx clear NECK:  Supple without lymphadenopathy or thyromegaly ABDOMEN:  Soft, non-tender, no masses EXTREMITIES:  Moves all extremities well, without clubbing, cyanosis, or edema NEUROLOGIC:  Alert and oriented x 3, normal gait, CN II-XII grossly intact MENTAL STATUS:  appropriate BACK:  Non-tender to palpation, No CVAT SKIN:  Warm, dry, and intact   Results: U/A:   PVR =

## 2023-05-08 ENCOUNTER — Other Ambulatory Visit (HOSPITAL_COMMUNITY): Payer: Self-pay

## 2023-05-10 ENCOUNTER — Encounter: Payer: Self-pay | Admitting: Gastroenterology

## 2023-05-10 NOTE — Progress Notes (Signed)
 [facility] pancreatic fluid analysis  Pancreatic head cyst fluid cytology Nondiagnostic The specimen is devoid of epithelial cells and macrophages.  Epithelial cell atypia = acellular Cellularity = acellular Cell composition = acellular Mucin = present Proteinaceous debris = present Inflammation = absent  The rest of the pancreatic fluid analysis is still pending at this time.  The patient will be updated about these results and once the rest of the pancreatic fluid analysis returns, next steps in evaluation and treatment/follow-up will be considered.   Aloha Finner, MD [facility] Office # [phone]

## 2023-05-11 ENCOUNTER — Other Ambulatory Visit (HOSPITAL_COMMUNITY): Payer: Self-pay

## 2023-05-11 MED ORDER — SILDENAFIL CITRATE 100 MG PO TABS
100.0000 mg | ORAL_TABLET | Freq: Every day | ORAL | 3 refills | Status: DC | PRN
  Filled 2023-05-11: qty 10, 10d supply, fill #0
  Filled 2023-07-18: qty 10, 10d supply, fill #1

## 2023-05-22 ENCOUNTER — Ambulatory Visit: Payer: PPO | Admitting: Urology

## 2023-05-22 ENCOUNTER — Encounter: Payer: Self-pay | Admitting: Urology

## 2023-05-22 VITALS — BP 139/84 | HR 90 | Ht 72.0 in | Wt 265.0 lb

## 2023-05-22 DIAGNOSIS — R972 Elevated prostate specific antigen [PSA]: Secondary | ICD-10-CM | POA: Diagnosis not present

## 2023-05-22 DIAGNOSIS — N529 Male erectile dysfunction, unspecified: Secondary | ICD-10-CM

## 2023-05-22 DIAGNOSIS — N401 Enlarged prostate with lower urinary tract symptoms: Secondary | ICD-10-CM

## 2023-05-22 DIAGNOSIS — Z87898 Personal history of other specified conditions: Secondary | ICD-10-CM

## 2023-05-22 DIAGNOSIS — N138 Other obstructive and reflux uropathy: Secondary | ICD-10-CM | POA: Diagnosis not present

## 2023-05-22 LAB — BLADDER SCAN AMB NON-IMAGING

## 2023-05-22 NOTE — Progress Notes (Signed)
 Assessment: 1. BPH with obstruction/lower urinary tract symptoms   2. History of urinary retention   3. Elevated PSA   4. Organic impotence     Plan: Continue tamsulosin  0.4 mg twice daily. Recommend addition of finasteride  5 mg daily due to his large prostate volume.  Use and side effects of medicine discussed. Free and total PSA today Recommend trying sildenafil  100 mg several more times.  If no results, will consider an alternative oral therapy. Return to office in 3 months  Chief Complaint:  Chief Complaint  Patient presents with   Benign Prostatic Hypertrophy    History of Present Illness:  Harold Hayes is a 68 y.o. male who is seen for further evaluation of urinary retention and elevated PSA. He presented to his PCP on 02/22/2023 with acute urinary retention.  He reported several days of difficulty voiding.  He reported increased frequency, urgency, nocturia, and hesitancy.  No prior episodes of retention.  Bladder scan demonstrated around 500 mL.  A Foley catheter was placed which initially drained 300 mL.  He was started on tamsulosin  and Bactrim .    He has a history of elevated PSA. PSA results: 2/20 6.5 11/23 11.0 8/24 10.0 10/24 44.7  No prior prostate biopsy.  No family history of prostate cancer. DRE 02/27/23:  50 g gland, difficult to feel base, NT His Foley was removed on 02/28/2023 after a successful voiding trial.  He continued on tamsulosin  and completed a course of Bactrim . He was voiding spontaneously after the catheter was removed.  He continued with some intermittent stream and decreased stream.  He also had frequency, urgency, and nocturia.  No gross hematuria.  He continued on tamsulosin  0.4 mg daily. PVR = 94 ml. His dose of tamsulosin  was increased to 0.4 mg twice daily.  MRI of the prostate from 04/15/2023 showed a prostate volume of 120 cm, no focal lesion of intermediate or higher suspicion for prostate cancer identified.  Returns today for  follow-up.  He has been voiding spontaneously.  He continues on tamsulosin  twice daily.  His urinary symptoms are improved.  No dysuria or gross hematuria. He is having problems achieving an erection.  He has tried sildenafil  100 mg on 1 or 2 occasions without significant improvement. IPSS = 3  Portions of the above documentation were copied from a prior visit for review purposes only.  Past Medical History:  Past Medical History:  Diagnosis Date   Hyperlipidemia    Hypertension    Numbness of arm 06/20/2016    Past Surgical History:  Past Surgical History:  Procedure Laterality Date   BIOPSY  04/23/2023   Procedure: BIOPSY;  Surgeon: Wilhelmenia Aloha Raddle., MD;  Location: [facility];  Service: Gastroenterology;;   COLONOSCOPY     ESOPHAGOGASTRODUODENOSCOPY (EGD) WITH PROPOFOL  N/A 04/23/2023   Procedure: ESOPHAGOGASTRODUODENOSCOPY (EGD) WITH PROPOFOL ;  Surgeon: Wilhelmenia Aloha Raddle., MD;  Location: [facility];  Service: Gastroenterology;  Laterality: N/A;   EUS N/A 04/23/2023   Procedure: UPPER ENDOSCOPIC ULTRASOUND (EUS) RADIAL;  Surgeon: Wilhelmenia Aloha Raddle., MD;  Location: [facility];  Service: Gastroenterology;  Laterality: N/A;   FINE NEEDLE ASPIRATION N/A 04/23/2023   Procedure: FINE NEEDLE ASPIRATION (FNA) LINEAR;  Surgeon: Wilhelmenia Aloha Raddle., MD;  Location: [facility];  Service: Gastroenterology;  Laterality: N/A;   SPINE SURGERY      Allergies:  Allergies  Allergen Reactions   Lisinopril  Cough and Shortness Of Breath    Family History:  Family History  Problem Relation Age of Onset  Diabetes Mother    Diabetes Sister    Diabetes Sister    Hypertension Daughter    Colon cancer Neg Hx    Esophageal cancer Neg Hx    Rectal cancer Neg Hx    Stomach cancer Neg Hx    Colon polyps Neg Hx     Social History:  Social History   Tobacco Use   Smoking status: Former    Current packs/day: 0.00    Average packs/day: 1.5 packs/day for 20.0 years  (30.0 ttl pk-yrs)    Types: Cigarettes    Start date: 05/08/1968    Quit date: 05/08/1988    Years since quitting: 35.0   Smokeless tobacco: Never  Vaping Use   Vaping status: Never Used  Substance Use Topics   Alcohol use: No   Drug use: No    Comment: 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    ROS: Constitutional:  Negative for fever, chills, weight loss CV: Negative for chest pain, previous MI, hypertension Respiratory:  Negative for shortness of breath, wheezing, sleep apnea, frequent cough GI:  Negative for nausea, vomiting, bloody stool, GERD   Physical exam: BP 139/84   Pulse 90   Ht 6' (1.829 m)   Wt 265 lb (120.2 kg)   BMI 35.94 kg/m  GENERAL APPEARANCE:  Well appearing, well developed, well nourished, NAD HEENT:  Atraumatic, normocephalic, oropharynx clear NECK:  Supple without lymphadenopathy or thyromegaly ABDOMEN:  Soft, non-tender, no masses EXTREMITIES:  Moves all extremities well, without clubbing, cyanosis, or edema NEUROLOGIC:  Alert and oriented x 3, normal gait, CN II-XII grossly intact MENTAL STATUS:  appropriate BACK:  Non-tender to palpation, No CVAT SKIN:  Warm, dry, and intact   Results: U/A: Negative  PVR = 23 mL

## 2023-05-23 ENCOUNTER — Encounter: Payer: Self-pay | Admitting: Urology

## 2023-05-23 LAB — PSA, TOTAL AND FREE
PSA, Free Pct: 15.9 %
PSA, Free: 2.07 ng/mL
Prostate Specific Ag, Serum: 13 ng/mL — ABNORMAL HIGH (ref 0.0–4.0)

## 2023-05-24 LAB — URINALYSIS, ROUTINE W REFLEX MICROSCOPIC
Bilirubin, UA: NEGATIVE
Glucose, UA: NEGATIVE
Ketones, UA: NEGATIVE
Leukocytes,UA: NEGATIVE
Nitrite, UA: NEGATIVE
Protein,UA: NEGATIVE
RBC, UA: NEGATIVE
Specific Gravity, UA: 1.02 (ref 1.005–1.030)
Urobilinogen, Ur: 0.2 mg/dL (ref 0.2–1.0)
pH, UA: 6.5 (ref 5.0–7.5)

## 2023-05-25 NOTE — Progress Notes (Signed)
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  I recommend a repeat MRI/MRCP in 1 year.  If there is further increase in size, then consideration of referral to pancreaticobiliary surgery to consider role of surgical management can be had versus continued surveillance and monitoring.  We will relay this information to the patient and to the patient's primary gastroenterologist who can pursue follow-up MRI/MRCP imaging.  If something else develops or changes in the patient, then earlier repeat imaging can be considered.

## 2023-05-28 NOTE — Progress Notes (Signed)
Pt advised and reminder sent to set up MRI MRCP in 1 year

## 2023-06-07 ENCOUNTER — Other Ambulatory Visit (HOSPITAL_COMMUNITY): Payer: Self-pay

## 2023-06-14 NOTE — Progress Notes (Signed)
 Patient's final [facility] results have returned Statistically indolent with 97% probability of benign disease over the next 3 years.  Patient has significant molecular alteration but lacks concerning clinical features.  Will get this results scanned into the chart.  No change in plan from our previously noted 1 year MRI/MRCP.

## 2023-06-18 ENCOUNTER — Other Ambulatory Visit (HOSPITAL_COMMUNITY): Payer: Self-pay

## 2023-06-20 ENCOUNTER — Other Ambulatory Visit (HOSPITAL_COMMUNITY): Payer: Self-pay

## 2023-07-18 ENCOUNTER — Other Ambulatory Visit (HOSPITAL_COMMUNITY): Payer: Self-pay

## 2023-07-19 ENCOUNTER — Other Ambulatory Visit (HOSPITAL_COMMUNITY): Payer: Self-pay

## 2023-08-20 ENCOUNTER — Ambulatory Visit: Payer: PPO | Admitting: Urology

## 2023-08-29 ENCOUNTER — Other Ambulatory Visit (HOSPITAL_COMMUNITY): Payer: Self-pay

## 2023-08-29 ENCOUNTER — Ambulatory Visit: Admitting: Urology

## 2023-08-29 ENCOUNTER — Encounter: Payer: Self-pay | Admitting: Urology

## 2023-08-29 VITALS — BP 143/87 | HR 85 | Ht 72.0 in | Wt 265.0 lb

## 2023-08-29 DIAGNOSIS — N529 Male erectile dysfunction, unspecified: Secondary | ICD-10-CM | POA: Diagnosis not present

## 2023-08-29 DIAGNOSIS — N138 Other obstructive and reflux uropathy: Secondary | ICD-10-CM

## 2023-08-29 DIAGNOSIS — N401 Enlarged prostate with lower urinary tract symptoms: Secondary | ICD-10-CM

## 2023-08-29 DIAGNOSIS — Z87898 Personal history of other specified conditions: Secondary | ICD-10-CM

## 2023-08-29 DIAGNOSIS — R972 Elevated prostate specific antigen [PSA]: Secondary | ICD-10-CM

## 2023-08-29 LAB — URINALYSIS, ROUTINE W REFLEX MICROSCOPIC
Bilirubin, UA: NEGATIVE
Glucose, UA: NEGATIVE
Ketones, UA: NEGATIVE
Leukocytes,UA: NEGATIVE
Nitrite, UA: NEGATIVE
Protein,UA: NEGATIVE
RBC, UA: NEGATIVE
Specific Gravity, UA: 1.025 (ref 1.005–1.030)
Urobilinogen, Ur: 0.2 mg/dL (ref 0.2–1.0)
pH, UA: 6 (ref 5.0–7.5)

## 2023-08-29 MED ORDER — TADALAFIL 20 MG PO TABS
20.0000 mg | ORAL_TABLET | Freq: Every day | ORAL | 11 refills | Status: AC | PRN
  Filled 2023-08-29: qty 10, 10d supply, fill #0
  Filled 2023-09-15: qty 10, 10d supply, fill #1
  Filled 2023-10-05: qty 10, 10d supply, fill #2
  Filled 2023-11-30: qty 10, 10d supply, fill #3
  Filled 2024-01-01: qty 10, 10d supply, fill #4
  Filled 2024-02-04: qty 10, 10d supply, fill #5
  Filled 2024-03-05: qty 10, 10d supply, fill #6
  Filled 2024-05-02: qty 10, 10d supply, fill #7
  Filled 2024-05-22: qty 10, 10d supply, fill #8

## 2023-08-29 MED ORDER — FINASTERIDE 5 MG PO TABS
5.0000 mg | ORAL_TABLET | Freq: Every day | ORAL | 11 refills | Status: DC
  Filled 2023-08-29: qty 30, 30d supply, fill #0
  Filled 2023-09-24 – 2023-09-25 (×2): qty 30, 30d supply, fill #1
  Filled 2023-10-28: qty 30, 30d supply, fill #2

## 2023-08-29 MED ORDER — TADALAFIL 20 MG PO TABS: 20.0000 mg | ORAL_TABLET | Freq: Every day | ORAL | 11 refills | Status: DC | PRN

## 2023-08-29 MED ORDER — FINASTERIDE 5 MG PO TABS: 5.0000 mg | ORAL_TABLET | Freq: Every day | ORAL | 11 refills | Status: DC

## 2023-08-29 NOTE — Progress Notes (Signed)
 Assessment: 1. BPH with obstruction/lower urinary tract symptoms   2. History of urinary retention   3. Organic impotence   4. Elevated PSA    Plan: Continue tamsulosin  0.4 mg twice daily. Recommend addition of finasteride  5 mg daily due to his large prostate volume.  Use and side effects of medicine discussed. PSA today Trial of tadalafil  20 mg as needed in place of sildenafil .  Prescription sent.   Return to office in 3 months  Chief Complaint:  Chief Complaint  Patient presents with   Benign Prostatic Hypertrophy    History of Present Illness:  Harold Hayes is a 68 y.o. male who is seen for further evaluation of urinary retention and elevated PSA. He presented to his PCP on 02/22/2023 with acute urinary retention.  He reported several days of difficulty voiding.  He reported increased frequency, urgency, nocturia, and hesitancy.  No prior episodes of retention.  Bladder scan demonstrated around 500 mL.  A Foley catheter was placed which initially drained 300 mL.  He was started on tamsulosin  and Bactrim .    He has a history of elevated PSA. PSA results: 2/20 6.5 11/23 11.0 8/24 10.0 10/24 44.7  No prior prostate biopsy.  No family history of prostate cancer. DRE 02/27/23:  50 g gland, difficult to feel base, NT His Foley was removed on 02/28/2023 after a successful voiding trial.  He continued on tamsulosin  and completed a course of Bactrim . He was voiding spontaneously after the catheter was removed.  He continued with some intermittent stream and decreased stream.  He also had frequency, urgency, and nocturia.  No gross hematuria.  He continued on tamsulosin  0.4 mg daily. PVR = 94 ml. His dose of tamsulosin  was increased to 0.4 mg twice daily.  MRI of the prostate from 04/15/2023 showed a prostate volume of 120 cm, no focal lesion of intermediate or higher suspicion for prostate cancer identified.  At his visit in 1/25, he was voiding spontaneously.  He continued on  tamsulosin  twice daily.  His urinary symptoms were improved.  No dysuria or gross hematuria. He was having problems achieving an erection.  He had tried sildenafil  100 mg on 1 or 2 occasions without significant improvement. IPSS = 3 PVR = 23 ml.  PSA 1/25: 13 with 15.9% free.  He returns today for follow-up.  He continues on tamsulosin .  His lower urinary tract symptoms are stable.  He does have nocturia x 3 and some urgency.  No dysuria or gross hematuria. IPSS = 4/1. He continues to have problems achieving and maintaining his erections.  No improvement with sildenafil  100 mg as needed.  Portions of the above documentation were copied from a prior visit for review purposes only.  Past Medical History:  Past Medical History:  Diagnosis Date   Hyperlipidemia    Hypertension    Numbness of arm 06/20/2016    Past Surgical History:  Past Surgical History:  Procedure Laterality Date   BIOPSY  04/23/2023   Procedure: BIOPSY;  Surgeon: Brice Campi Albino Alu., MD;  Location: [facility];  Service: Gastroenterology;;   COLONOSCOPY     ESOPHAGOGASTRODUODENOSCOPY (EGD) WITH PROPOFOL  N/A 04/23/2023   Procedure: ESOPHAGOGASTRODUODENOSCOPY (EGD) WITH PROPOFOL ;  Surgeon: Normie Becton., MD;  Location: [facility];  Service: Gastroenterology;  Laterality: N/A;   EUS N/A 04/23/2023   Procedure: UPPER ENDOSCOPIC ULTRASOUND (EUS) RADIAL;  Surgeon: Normie Becton., MD;  Location: [facility];  Service: Gastroenterology;  Laterality: N/A;   FINE NEEDLE ASPIRATION N/A 04/23/2023  Procedure: FINE NEEDLE ASPIRATION (FNA) LINEAR;  Surgeon: Normie Becton., MD;  Location: [facility];  Service: Gastroenterology;  Laterality: N/A;   SPINE SURGERY      Allergies:  Allergies  Allergen Reactions   Lisinopril  Cough and Shortness Of Breath    Family History:  Family History  Problem Relation Age of Onset   Diabetes Mother    Diabetes Sister    Diabetes Sister     Hypertension Daughter    Colon cancer Neg Hx    Esophageal cancer Neg Hx    Rectal cancer Neg Hx    Stomach cancer Neg Hx    Colon polyps Neg Hx     Social History:  Social History   Tobacco Use   Smoking status: Former    Current packs/day: 0.00    Average packs/day: 1.5 packs/day for 20.0 years (30.0 ttl pk-yrs)    Types: Cigarettes    Start date: 05/08/1968    Quit date: 05/08/1988    Years since quitting: 35.3   Smokeless tobacco: Never  Vaping Use   Vaping status: Never Used  Substance Use Topics   Alcohol use: No   Drug use: No    Comment: 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    ROS: Constitutional:  Negative for fever, chills, weight loss CV: Negative for chest pain, previous MI, hypertension Respiratory:  Negative for shortness of breath, wheezing, sleep apnea, frequent cough GI:  Negative for nausea, vomiting, bloody stool, GERD   Physical exam: BP (!) 143/87   Pulse 85   Ht 6' (1.829 m)   Wt 265 lb (120.2 kg)   BMI 35.94 kg/m  GENERAL APPEARANCE:  Well appearing, well developed, well nourished, NAD HEENT:  Atraumatic, normocephalic, oropharynx clear NECK:  Supple without lymphadenopathy or thyromegaly ABDOMEN:  Soft, non-tender, no masses EXTREMITIES:  Moves all extremities well, without clubbing, cyanosis, or edema NEUROLOGIC:  Alert and oriented x 3, normal gait, CN II-XII grossly intact MENTAL STATUS:  appropriate BACK:  Non-tender to palpation, No CVAT SKIN:  Warm, dry, and intact  Results: U/A: Negative

## 2023-08-29 NOTE — Addendum Note (Signed)
 Addended by: General Kenner on: 08/29/2023 11:32 AM   Modules accepted: Orders

## 2023-08-30 ENCOUNTER — Encounter: Payer: Self-pay | Admitting: Urology

## 2023-08-30 LAB — PSA: Prostate Specific Ag, Serum: 11.7 ng/mL — ABNORMAL HIGH (ref 0.0–4.0)

## 2023-09-07 ENCOUNTER — Other Ambulatory Visit (HOSPITAL_COMMUNITY): Payer: Self-pay

## 2023-09-17 ENCOUNTER — Other Ambulatory Visit (HOSPITAL_COMMUNITY): Payer: Self-pay

## 2023-09-24 ENCOUNTER — Other Ambulatory Visit (HOSPITAL_COMMUNITY): Payer: Self-pay

## 2023-10-05 ENCOUNTER — Other Ambulatory Visit (HOSPITAL_COMMUNITY): Payer: Self-pay

## 2023-10-31 ENCOUNTER — Other Ambulatory Visit (HOSPITAL_COMMUNITY): Payer: Self-pay

## 2023-11-02 ENCOUNTER — Other Ambulatory Visit (HOSPITAL_COMMUNITY): Payer: Self-pay

## 2023-11-05 ENCOUNTER — Other Ambulatory Visit (HOSPITAL_COMMUNITY): Payer: Self-pay

## 2023-11-28 ENCOUNTER — Other Ambulatory Visit (HOSPITAL_COMMUNITY): Payer: Self-pay

## 2023-11-28 ENCOUNTER — Ambulatory Visit: Admitting: Urology

## 2023-11-28 ENCOUNTER — Encounter: Payer: Self-pay | Admitting: Urology

## 2023-11-28 VITALS — BP 128/78 | HR 67 | Ht 72.0 in | Wt 287.0 lb

## 2023-11-28 DIAGNOSIS — N401 Enlarged prostate with lower urinary tract symptoms: Secondary | ICD-10-CM

## 2023-11-28 DIAGNOSIS — R972 Elevated prostate specific antigen [PSA]: Secondary | ICD-10-CM

## 2023-11-28 DIAGNOSIS — Z87898 Personal history of other specified conditions: Secondary | ICD-10-CM

## 2023-11-28 DIAGNOSIS — N529 Male erectile dysfunction, unspecified: Secondary | ICD-10-CM

## 2023-11-28 DIAGNOSIS — Z87448 Personal history of other diseases of urinary system: Secondary | ICD-10-CM

## 2023-11-28 DIAGNOSIS — N138 Other obstructive and reflux uropathy: Secondary | ICD-10-CM

## 2023-11-28 DIAGNOSIS — R338 Other retention of urine: Secondary | ICD-10-CM

## 2023-11-28 LAB — URINALYSIS, ROUTINE W REFLEX MICROSCOPIC
Bilirubin, UA: NEGATIVE
Glucose, UA: NEGATIVE
Ketones, UA: NEGATIVE
Leukocytes,UA: NEGATIVE
Nitrite, UA: NEGATIVE
Protein,UA: NEGATIVE
RBC, UA: NEGATIVE
Specific Gravity, UA: 1.025 (ref 1.005–1.030)
Urobilinogen, Ur: 1 mg/dL (ref 0.2–1.0)
pH, UA: 6 (ref 5.0–7.5)

## 2023-11-28 MED ORDER — FINASTERIDE 5 MG PO TABS
5.0000 mg | ORAL_TABLET | Freq: Every day | ORAL | 3 refills | Status: AC
  Filled 2023-11-28: qty 90, 90d supply, fill #0
  Filled 2024-02-25: qty 90, 90d supply, fill #1
  Filled 2024-05-22: qty 90, 90d supply, fill #2

## 2023-11-28 MED ORDER — TAMSULOSIN HCL 0.4 MG PO CAPS
0.4000 mg | ORAL_CAPSULE | Freq: Two times a day (BID) | ORAL | 3 refills | Status: AC
  Filled 2023-11-28: qty 180, 90d supply, fill #0
  Filled 2024-02-25: qty 180, 90d supply, fill #1
  Filled 2024-05-22: qty 180, 90d supply, fill #2

## 2023-11-28 NOTE — Progress Notes (Signed)
 Assessment: 1. BPH with obstruction/lower urinary tract symptoms   2. History of urinary retention   3. Organic impotence   4. Elevated PSA     Plan: Continue tamsulosin  0.4 mg twice daily. Continue finasteride  5 mg daily. Continue tadalafil  20 mg as needed. Return to office in 3 months Free and total PSA next visit.  Chief Complaint:  Chief Complaint  Patient presents with   Benign Prostatic Hypertrophy    History of Present Illness:  Harold Hayes is a 68 y.o. male who is seen for further evaluation of BPH with lower urinary tract symptoms, history of urinary retention and elevated PSA. He presented to his PCP on 02/22/2023 with acute urinary retention.  He reported several days of difficulty voiding.  He reported increased frequency, urgency, nocturia, and hesitancy.  No prior episodes of retention.  Bladder scan demonstrated around 500 mL.  A Foley catheter was placed which initially drained 300 mL.  He was started on tamsulosin  and Bactrim .    He has a history of elevated PSA. PSA results: 2/20 6.5 11/23 11.0 8/24 10.0 10/24 44.7  No prior prostate biopsy.  No family history of prostate cancer. DRE 02/27/23:  50 g gland, difficult to feel base, NT His Foley was removed on 02/28/2023 after a successful voiding trial.  He continued on tamsulosin  and completed a course of Bactrim . He was voiding spontaneously after the catheter was removed.  He continued with some intermittent stream and decreased stream.  He also had frequency, urgency, and nocturia.  No gross hematuria.  He continued on tamsulosin  0.4 mg daily. PVR = 94 ml. His dose of tamsulosin  was increased to 0.4 mg twice daily.  MRI of the prostate from 04/15/2023 showed a prostate volume of 120 cm, no focal lesion of intermediate or higher suspicion for prostate cancer identified.  At his visit in 1/25, he was voiding spontaneously.  He continued on tamsulosin  twice daily.  His urinary symptoms were improved.  No  dysuria or gross hematuria. He was having problems achieving an erection.  He had tried sildenafil  100 mg on 1 or 2 occasions without significant improvement. IPSS = 3 PVR = 23 ml.  PSA 1/25:  13 with 15.9% free. PSA 4/25:  11.7  At his visit in April 2025, he continued on tamsulosin .  His lower urinary tract symptoms remained stable with nocturia x 3 and some urgency.  No dysuria or gross hematuria. IPSS = 4/1. He was started on finasteride  in April 2025. He continued to have problems achieving and maintaining his erections.  No improvement with sildenafil  100 mg as needed. He was given a trial of tadalafil  20 mg as needed.  He returns today for follow-up.  He continues on tamsulosin  and finasteride  for management of his BPH.  His lower urinary tract symptoms are stable.  He continues with nocturia x 2.  No dysuria or gross hematuria. IPSS = 2/1. He has tried the tadalafil  20 mg but did not see a significant improvement over sildenafil . He is not concerned about his erections at the present time due to his wife's health issues.  Portions of the above documentation were copied from a prior visit for review purposes only.  Past Medical History:  Past Medical History:  Diagnosis Date   Hyperlipidemia    Hypertension    Numbness of arm 06/20/2016    Past Surgical History:  Past Surgical History:  Procedure Laterality Date   BIOPSY  04/23/2023   Procedure: BIOPSY;  Surgeon:  Mansouraty, Aloha Raddle., MD;  Location: [facility];  Service: Gastroenterology;;   COLONOSCOPY     ESOPHAGOGASTRODUODENOSCOPY (EGD) WITH PROPOFOL  N/A 04/23/2023   Procedure: ESOPHAGOGASTRODUODENOSCOPY (EGD) WITH PROPOFOL ;  Surgeon: Wilhelmenia Aloha Raddle., MD;  Location: [facility];  Service: Gastroenterology;  Laterality: N/A;   EUS N/A 04/23/2023   Procedure: UPPER ENDOSCOPIC ULTRASOUND (EUS) RADIAL;  Surgeon: Wilhelmenia Aloha Raddle., MD;  Location: [facility];  Service: Gastroenterology;  Laterality:  N/A;   FINE NEEDLE ASPIRATION N/A 04/23/2023   Procedure: FINE NEEDLE ASPIRATION (FNA) LINEAR;  Surgeon: Wilhelmenia Aloha Raddle., MD;  Location: [facility];  Service: Gastroenterology;  Laterality: N/A;   SPINE SURGERY      Allergies:  Allergies  Allergen Reactions   Lisinopril  Cough and Shortness Of Breath    Family History:  Family History  Problem Relation Age of Onset   Diabetes Mother    Diabetes Sister    Diabetes Sister    Hypertension Daughter    Colon cancer Neg Hx    Esophageal cancer Neg Hx    Rectal cancer Neg Hx    Stomach cancer Neg Hx    Colon polyps Neg Hx     Social History:  Social History   Tobacco Use   Smoking status: Former    Current packs/day: 0.00    Average packs/day: 1.5 packs/day for 20.0 years (30.0 ttl pk-yrs)    Types: Cigarettes    Start date: 05/08/1968    Quit date: 05/08/1988    Years since quitting: 35.5   Smokeless tobacco: Never  Vaping Use   Vaping status: Never Used  Substance Use Topics   Alcohol use: No   Drug use: No    Comment: 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    ROS: Constitutional:  Negative for fever, chills, weight loss CV: Negative for chest pain, previous MI, hypertension Respiratory:  Negative for shortness of breath, wheezing, sleep apnea, frequent cough GI:  Negative for nausea, vomiting, bloody stool, GERD   Physical exam: BP 128/78   Pulse 67   Ht 6' (1.829 m)   Wt 287 lb (130.2 kg)   BMI 38.92 kg/m  GENERAL APPEARANCE:  Well appearing, well developed, well nourished, NAD HEENT:  Atraumatic, normocephalic, oropharynx clear NECK:  Supple without lymphadenopathy or thyromegaly ABDOMEN:  Soft, non-tender, no masses EXTREMITIES:  Moves all extremities well, without clubbing, cyanosis, or edema NEUROLOGIC:  Alert and oriented x 3, normal gait, CN II-XII grossly intact MENTAL STATUS:  appropriate BACK:  Non-tender to palpation, No CVAT SKIN:  Warm, dry, and intact  Results: U/A:  Negative

## 2023-12-03 ENCOUNTER — Other Ambulatory Visit (HOSPITAL_COMMUNITY): Payer: Self-pay

## 2023-12-26 ENCOUNTER — Ambulatory Visit: Payer: PPO

## 2023-12-26 VITALS — Ht 73.0 in | Wt 275.0 lb

## 2023-12-26 DIAGNOSIS — Z0000 Encounter for general adult medical examination without abnormal findings: Secondary | ICD-10-CM | POA: Diagnosis not present

## 2023-12-26 NOTE — Patient Instructions (Signed)
 Mr. Terpstra , Thank you for taking time out of your busy schedule to complete your Annual Wellness Visit with me. I enjoyed our conversation and look forward to speaking with you again next year. I, as well as your care team,  appreciate your ongoing commitment to your health goals. Please review the following plan we discussed and let me know if I can assist you in the future. Your Game plan/ To Do List    Referrals: If you haven't heard from the office you've been referred to, please reach out to them at the phone provided.   Follow up Visits: We will see or speak with you next year for your Next Medicare AWV with our clinical staff Have you seen your provider in the last 6 months (3 months if uncontrolled diabetes)? Yes  Clinician Recommendations:  Aim for 30 minutes of exercise or brisk walking, 6-8 glasses of water, and 5 servings of fruits and vegetables each day.       This is a list of the screenings recommended for you:  Health Maintenance  Topic Date Due   Zoster (Shingles) Vaccine (1 of 2) Never done   COVID-19 Vaccine (4 - 2024-25 season) 01/07/2023   Flu Shot  12/07/2023   Medicare Annual Wellness Visit  12/25/2024   Colon Cancer Screening  07/30/2025   DTaP/Tdap/Td vaccine (2 - Td or Tdap) 09/22/2029   Pneumococcal Vaccine for age over 94  Completed   Hepatitis C Screening  Completed   HPV Vaccine  Aged Out   Meningitis B Vaccine  Aged Out   Pneumococcal Vaccine  Discontinued    Advanced directives: (Declined) Advance directive discussed with you today. Even though you declined this today, please call our office should you change your mind, and we can give you the proper paperwork for you to fill out. Advance Care Planning is important because it:  [x]  Makes sure you receive the medical care that is consistent with your values, goals, and preferences  [x]  It provides guidance to your family and loved ones and reduces their decisional burden about whether or not they are  making the right decisions based on your wishes.  Follow the link provided in your after visit summary or read over the paperwork we have mailed to you to help you started getting your Advance Directives in place. If you need assistance in completing these, please reach out to us  so that we can help you!  See attachments for Preventive Care and Fall Prevention Tips.

## 2023-12-26 NOTE — Progress Notes (Signed)
 Because this visit was a virtual/telehealth visit,  certain criteria was not obtained, such a blood pressure, CBG if applicable, and timed get up and go. Any medications not marked as taking were not mentioned during the medication reconciliation part of the visit. Any vitals not documented were not able to be obtained due to this being a telehealth visit or patient was unable to self-report a recent blood pressure reading due to a lack of equipment at home via telehealth. Vitals that have been documented are verbally provided by the patient.   Subjective:   Harold Hayes is a 68 y.o. who presents for a Medicare Wellness preventive visit.  As a reminder, Annual Wellness Visits don't include a physical exam, and some assessments may be limited, especially if this visit is performed virtually. We may recommend an in-person follow-up visit with your provider if needed.  Visit Complete: Virtual I connected with  Harold Hayes on 12/26/23 by a audio enabled telemedicine application and verified that I am speaking with the correct person using two identifiers.  Patient Location: Home  Provider Location: Home Office  I discussed the limitations of evaluation and management by telemedicine. The patient expressed understanding and agreed to proceed.  Vital Signs: Because this visit was a virtual/telehealth visit, some criteria may be missing or patient reported. Any vitals not documented were not able to be obtained and vitals that have been documented are patient reported.  VideoDeclined- This patient declined Librarian, academic. Therefore the visit was completed with audio only.  Persons Participating in Visit: Patient.  AWV Questionnaire: No: Patient Medicare AWV questionnaire was not completed prior to this visit.  Cardiac Risk Factors include: advanced age (>74men, >48 women);male gender;dyslipidemia;hypertension;obesity (BMI >30kg/m2);sedentary lifestyle      Objective:    Today's Vitals   12/26/23 0838  Weight: 275 lb (124.7 kg)  Height: 6' 1 (1.854 m)  PainSc: 6   PainLoc: Back   Body mass index is 36.28 kg/m.     12/26/2023    8:42 AM 04/23/2023    8:18 AM 02/22/2023    3:04 PM 01/02/2023    9:41 AM 12/27/2022    8:26 AM 03/13/2022    9:17 AM 01/12/2021    9:31 AM  Advanced Directives  Does Patient Have a Medical Advance Directive? No No No No Yes No No  Type of Agricultural consultant;Living will    Copy of Healthcare Power of Attorney in Chart?     No - copy requested    Would patient like information on creating a medical advance directive? No - Patient declined No - Patient declined    No - Patient declined No - Patient declined    Current Medications (verified) Outpatient Encounter Medications as of 12/26/2023  Medication Sig   amLODipine  (NORVASC ) 5 MG tablet Take 1 tablet (5 mg total) by mouth daily.   finasteride  (PROSCAR ) 5 MG tablet Take 1 tablet (5 mg total) by mouth daily.   hydrochlorothiazide  (HYDRODIURIL ) 25 MG tablet Take 1 tablet (25 mg total) by mouth daily.   pantoprazole  (PROTONIX ) 40 MG tablet Take 1 tablet (40 mg total) by mouth 2 (two) times daily before a meal.   rosuvastatin  (CRESTOR ) 20 MG tablet Take 1 tablet (20 mg total) by mouth daily.   tadalafil  (CIALIS ) 20 MG tablet Take 1 tablet (20 mg total) by mouth daily as needed for erectile dysfunction.   tamsulosin  (FLOMAX ) 0.4 MG CAPS  capsule Take 1 capsule (0.4 mg total) by mouth 2 (two) times daily.   No facility-administered encounter medications on file as of 12/26/2023.    Allergies (verified) Lisinopril    History: Past Medical History:  Diagnosis Date   Hyperlipidemia    Hypertension    Numbness of arm 06/20/2016   Past Surgical History:  Procedure Laterality Date   BIOPSY  04/23/2023   Procedure: BIOPSY;  Surgeon: Wilhelmenia Aloha Raddle., MD;  Location: [facility];  Service: Gastroenterology;;   COLONOSCOPY      ESOPHAGOGASTRODUODENOSCOPY (EGD) WITH PROPOFOL  N/A 04/23/2023   Procedure: ESOPHAGOGASTRODUODENOSCOPY (EGD) WITH PROPOFOL ;  Surgeon: Wilhelmenia Aloha Raddle., MD;  Location: [facility];  Service: Gastroenterology;  Laterality: N/A;   EUS N/A 04/23/2023   Procedure: UPPER ENDOSCOPIC ULTRASOUND (EUS) RADIAL;  Surgeon: Wilhelmenia Aloha Raddle., MD;  Location: [facility];  Service: Gastroenterology;  Laterality: N/A;   FINE NEEDLE ASPIRATION N/A 04/23/2023   Procedure: FINE NEEDLE ASPIRATION (FNA) LINEAR;  Surgeon: Wilhelmenia Aloha Raddle., MD;  Location: [facility];  Service: Gastroenterology;  Laterality: N/A;   SPINE SURGERY     Family History  Problem Relation Age of Onset   Diabetes Mother    Diabetes Sister    Diabetes Sister    Hypertension Daughter    Colon cancer Neg Hx    Esophageal cancer Neg Hx    Rectal cancer Neg Hx    Stomach cancer Neg Hx    Colon polyps Neg Hx    Social History   Socioeconomic History   Marital status: Married    Spouse name: Shabra   Number of children: 4   Years of education: Not on file   Highest education level: Not on file  Occupational History   Occupation: working full time  Tobacco Use   Smoking status: Former    Current packs/day: 0.00    Average packs/day: 1.5 packs/day for 20.0 years (30.0 ttl pk-yrs)    Types: Cigarettes    Start date: 05/08/1968    Quit date: 05/08/1988    Years since quitting: 35.6   Smokeless tobacco: Never  Vaping Use   Vaping status: Never Used  Substance and Sexual Activity   Alcohol use: No   Drug use: No    Comment: 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   Sexual activity: Yes    Birth control/protection: None  Other Topics Concern   Not on file  Social History Narrative   Not on file   Social Drivers of Health   Financial Resource Strain: High Risk (12/26/2023)   Overall Financial Resource Strain (CARDIA)    Difficulty of Paying Living Expenses: Hard  Food Insecurity: No Food  Insecurity (12/26/2023)   Hunger Vital Sign    Worried About Running Out of Food in the Last Year: Never true    Ran Out of Food in the Last Year: Never true  Transportation Needs: No Transportation Needs (12/26/2023)   PRAPARE - Administrator, Civil Service (Medical): No    Lack of Transportation (Non-Medical): No  Physical Activity: Inactive (12/26/2023)   Exercise Vital Sign    Days of Exercise per Week: 0 days    Minutes of Exercise per Session: 0 min  Stress: No Stress Concern Present (12/26/2023)   [employer] of Occupational Health - Occupational Stress Questionnaire    Feeling of Stress: Not at all  Social Connections: Moderately Integrated (12/26/2023)   Social Connection and Isolation Panel    Frequency of Communication with  Friends and Family: Three times a week    Frequency of Social Gatherings with Friends and Family: Three times a week    Attends Religious Services: More than 4 times per year    Active Member of Clubs or Organizations: No    Attends Banker Meetings: Never    Marital Status: Married    Tobacco Counseling Counseling given: Not Answered    Clinical Intake:  Pre-visit preparation completed: Yes  Pain : 0-10 Pain Score: 6  Pain Type: Chronic pain Pain Location: Back Pain Orientation: Lower     BMI - recorded: 36.28 Nutritional Risks: None Diabetes: No  Lab Results  Component Value Date   HGBA1C 6.8 (H) 01/02/2023     How often do you need to have someone help you when you read instructions, pamphlets, or other written materials from your doctor or pharmacy?: 1 - Never  Interpreter Needed?: No  Information entered by :: Harold Fuller, LPN.   Activities of Daily Living     12/26/2023    8:44 AM 02/22/2023    3:06 PM  In your present state of health, do you have any difficulty performing the following activities:  Hearing? 0 0  Vision? 0 1  Comment  reading glasses  Difficulty concentrating or  making decisions? 0 0  Walking or climbing stairs? 0 1  Dressing or bathing? 0 0  Doing errands, shopping? 0 0  Preparing Food and eating ? N   Using the Toilet? N   In the past six months, have you accidently leaked urine? N   Do you have problems with loss of bowel control? N   Managing your Medications? N   Managing your Finances? N   Housekeeping or managing your Housekeeping? N     Patient Care Team: Elicia Sharper, DO as PCP - General Hobart Powell BRAVO, MD (Inactive) as PCP - Cardiology (Cardiology)  I have updated your Care Teams any recent Medical Services you may have received from other providers in the past year.     Assessment:   This is a routine wellness examination for Harold Hayes.  Hearing/Vision screen Hearing Screening - Comments:: Denies hearing difficulties.  Vision Screening - Comments:: Wears reading glasses - not up to date with routine eye exams.    Goals Addressed             This Visit's Progress    12/26/2023: To stay healthy.         Depression Screen     12/26/2023    8:44 AM 02/22/2023    3:05 PM 01/02/2023    9:41 AM 12/27/2022    8:31 AM 03/13/2022    9:18 AM 01/12/2021   10:00 AM 10/27/2020   10:54 AM  PHQ 2/9 Scores  PHQ - 2 Score 0 0 0 0 0 2 0  PHQ- 9 Score 0 0  0 2 7     Fall Risk     12/26/2023    8:42 AM 02/22/2023    3:05 PM 01/02/2023    9:41 AM 12/27/2022    8:26 AM 03/13/2022    9:17 AM  Fall Risk   Falls in the past year? 1 1 0 0 0  Number falls in past yr: 0 0 0 0   Injury with Fall? 0 0 0 0   Risk for fall due to : No Fall Risks Impaired balance/gait Impaired balance/gait No Fall Risks No Fall Risks  Follow up Falls evaluation completed;Education provided Falls  evaluation completed Falls prevention discussed;Falls evaluation completed Falls prevention discussed;Falls evaluation completed     MEDICARE RISK AT HOME:  Medicare Risk at Home Any stairs in or around the home?: No If so, are there any without handrails?:  No Home free of loose throw rugs in walkways, pet beds, electrical cords, etc?: Yes Adequate lighting in your home to reduce risk of falls?: Yes Life alert?: No Use of a cane, walker or w/c?: No Grab bars in the bathroom?: Yes Shower chair or bench in shower?: No Elevated toilet seat or a handicapped toilet?: Yes  TIMED UP AND GO:  Was the test performed?  No  Cognitive Function: Declined/Normal: No cognitive concerns noted by patient or family. Patient alert, oriented, able to answer questions appropriately and recall recent events. No signs of memory loss or confusion.    12/26/2023    8:44 AM  MMSE - Mini Mental State Exam  Not completed: Unable to complete        12/26/2023    8:43 AM 12/27/2022    8:27 AM  6CIT Screen  What Year? 0 points 0 points  What month? 0 points 0 points  What time? 0 points 0 points  Count back from 20 0 points 0 points  Months in reverse 0 points 4 points  Repeat phrase 0 points 2 points  Total Score 0 points 6 points    Immunizations Immunization History  Administered Date(s) Administered   Fluad Quad(high Dose 65+) 03/13/2022   PFIZER(Purple Top)SARS-COV-2 Vaccination 08/03/2019, 08/24/2019, 04/23/2020   PNEUMOCOCCAL CONJUGATE-20 03/10/2022   Tdap 09/23/2019    Screening Tests Health Maintenance  Topic Date Due   Zoster Vaccines- Shingrix (1 of 2) Never done   COVID-19 Vaccine (4 - 2024-25 season) 01/07/2023   INFLUENZA VACCINE  12/07/2023   Medicare Annual Wellness (AWV)  12/25/2024   Colonoscopy  07/30/2025   DTaP/Tdap/Td (2 - Td or Tdap) 09/22/2029   Pneumococcal Vaccine: 50+ Years  Completed   Hepatitis C Screening  Completed   HPV VACCINES  Aged Out   Meningococcal B Vaccine  Aged Out   Pneumococcal Vaccine  Discontinued    Health Maintenance  Health Maintenance Due  Topic Date Due   Zoster Vaccines- Shingrix (1 of 2) Never done   COVID-19 Vaccine (4 - 2024-25 season) 01/07/2023   INFLUENZA VACCINE  12/07/2023    Health Maintenance Items Addressed: Yes Patient aware of current care gaps.  Immunization record was verified by [facility] and updated in patient's chart.   Additional Screening:  Vision Screening: Recommended annual ophthalmology exams for early detection of glaucoma and other disorders of the eye. Would you like a referral to an eye doctor? No    Dental Screening: Recommended annual dental exams for proper oral hygiene  Community Resource Referral / Chronic Care Management: CRR required this visit?  No   CCM required this visit?  No   Plan:    I have personally reviewed and noted the following in the patient's chart:   Medical and social history Use of alcohol, tobacco or illicit drugs  Current medications and supplements including opioid prescriptions. Patient is not currently taking opioid prescriptions. Functional ability and status Nutritional status Physical activity Advanced directives List of other physicians Hospitalizations, surgeries, and ER visits in previous 12 months Vitals Screenings to include cognitive, depression, and falls Referrals and appointments  In addition, I have reviewed and discussed with patient certain preventive protocols, quality metrics, and best practice recommendations. A written personalized care  plan for preventive services as well as general preventive health recommendations were provided to patient.   Harold LOISE Fuller, LPN   1/79/7974   After Visit Summary: (MyChart) Due to this being a telephonic visit, the after visit summary with patients personalized plan was offered to patient via MyChart   Notes: Patient aware of current care gaps.  Immunization record was verified by [facility] and updated in patient's chart.

## 2024-01-01 ENCOUNTER — Other Ambulatory Visit (HOSPITAL_COMMUNITY): Payer: Self-pay

## 2024-01-08 ENCOUNTER — Other Ambulatory Visit (HOSPITAL_COMMUNITY): Payer: Self-pay

## 2024-01-08 ENCOUNTER — Other Ambulatory Visit: Payer: Self-pay | Admitting: Student

## 2024-01-08 ENCOUNTER — Other Ambulatory Visit (HOSPITAL_BASED_OUTPATIENT_CLINIC_OR_DEPARTMENT_OTHER): Payer: Self-pay

## 2024-01-08 DIAGNOSIS — I10 Essential (primary) hypertension: Secondary | ICD-10-CM

## 2024-01-08 NOTE — Telephone Encounter (Signed)
 Patient last seen 02/22/23 I called the patient to schedule a follow up appointment. Unable to reach the patient, I lvm for him to give us  a call back.

## 2024-01-09 ENCOUNTER — Other Ambulatory Visit (HOSPITAL_COMMUNITY): Payer: Self-pay

## 2024-01-09 ENCOUNTER — Other Ambulatory Visit: Payer: Self-pay | Admitting: Student

## 2024-01-09 DIAGNOSIS — I10 Essential (primary) hypertension: Secondary | ICD-10-CM

## 2024-01-09 MED ORDER — AMLODIPINE BESYLATE 5 MG PO TABS
5.0000 mg | ORAL_TABLET | Freq: Every day | ORAL | 0 refills | Status: DC
  Filled 2024-01-09: qty 30, 30d supply, fill #0

## 2024-01-09 MED ORDER — HYDROCHLOROTHIAZIDE 25 MG PO TABS
25.0000 mg | ORAL_TABLET | Freq: Every day | ORAL | 0 refills | Status: DC
  Filled 2024-01-09: qty 30, 30d supply, fill #0

## 2024-01-09 MED ORDER — ROSUVASTATIN CALCIUM 20 MG PO TABS
20.0000 mg | ORAL_TABLET | Freq: Every day | ORAL | 0 refills | Status: DC
  Filled 2024-01-09: qty 30, 30d supply, fill #0

## 2024-01-09 NOTE — Telephone Encounter (Signed)
 Hey sorry the rx request was for Amlodipine  and Rosuvastatin . I see that Harold Hayes refilled both of the medications today.

## 2024-01-09 NOTE — Telephone Encounter (Unsigned)
 Copied from CRM [id number]. Topic: Clinical - Medication Refill >> Jan 09, 2024 10:59 AM Miquel SAILOR wrote: Medication: hydrochlorothiazide  (HYDRODIURIL ) 25 MG tablet rosuvastatin  (CRESTOR ) 20 MG tablet amLODipine  (NORVASC ) 5 MG tablet  Has the patient contacted their pharmacy? Yes (Agent: If no, request that the patient contact the pharmacy for the refill. If patient does not wish to contact the pharmacy document the reason why and proceed with request.) (Agent: If yes, when and what did the pharmacy advise?)  This is the patient's preferred pharmacy:    [place] [facility] [address] [place] [address] Phone: [phone] Fax: [phone]  Is this the correct pharmacy for this prescription? Yes If no, delete pharmacy and type the correct one.   Has the prescription been filled recently? Yes  Is the patient out of the medication? No  Has the patient been seen for an appointment in the last year OR does the patient have an upcoming appointment? Yes  Can we respond through MyChart? Yes  Agent: Please be advised that Rx refills may take up to 3 business days. We ask that you follow-up with your pharmacy.

## 2024-01-10 NOTE — Progress Notes (Signed)
 Internal Medicine Attending:  I reviewed the AWV findings of the medical professional who conducted the visit. I was present in the office suite and immediately available to provide assistance and direction throughout the time the service was provided.

## 2024-01-16 ENCOUNTER — Ambulatory Visit: Admitting: Student

## 2024-01-16 ENCOUNTER — Other Ambulatory Visit: Payer: Self-pay

## 2024-01-16 ENCOUNTER — Encounter: Payer: Self-pay | Admitting: Student

## 2024-01-16 ENCOUNTER — Other Ambulatory Visit (HOSPITAL_COMMUNITY): Payer: Self-pay

## 2024-01-16 VITALS — BP 122/78 | HR 66 | Temp 97.8°F | Ht 72.0 in | Wt 286.8 lb

## 2024-01-16 DIAGNOSIS — N529 Male erectile dysfunction, unspecified: Secondary | ICD-10-CM

## 2024-01-16 DIAGNOSIS — E119 Type 2 diabetes mellitus without complications: Secondary | ICD-10-CM

## 2024-01-16 DIAGNOSIS — E785 Hyperlipidemia, unspecified: Secondary | ICD-10-CM

## 2024-01-16 DIAGNOSIS — I10 Essential (primary) hypertension: Secondary | ICD-10-CM

## 2024-01-16 DIAGNOSIS — Z23 Encounter for immunization: Secondary | ICD-10-CM | POA: Diagnosis not present

## 2024-01-16 DIAGNOSIS — Z0000 Encounter for general adult medical examination without abnormal findings: Secondary | ICD-10-CM

## 2024-01-16 DIAGNOSIS — R972 Elevated prostate specific antigen [PSA]: Secondary | ICD-10-CM | POA: Diagnosis not present

## 2024-01-16 DIAGNOSIS — Z6839 Body mass index (BMI) 39.0-39.9, adult: Secondary | ICD-10-CM

## 2024-01-16 MED ORDER — HYDROCHLOROTHIAZIDE 25 MG PO TABS
25.0000 mg | ORAL_TABLET | Freq: Every day | ORAL | 0 refills | Status: DC
  Filled 2024-01-16 – 2024-02-04 (×2): qty 90, 90d supply, fill #0

## 2024-01-16 MED ORDER — AMLODIPINE BESYLATE 5 MG PO TABS
5.0000 mg | ORAL_TABLET | Freq: Every day | ORAL | 0 refills | Status: DC
  Filled 2024-01-16 – 2024-02-04 (×2): qty 90, 90d supply, fill #0

## 2024-01-16 MED ORDER — ROSUVASTATIN CALCIUM 20 MG PO TABS
20.0000 mg | ORAL_TABLET | Freq: Every day | ORAL | 0 refills | Status: DC
  Filled 2024-01-16 – 2024-02-04 (×2): qty 90, 90d supply, fill #0

## 2024-01-16 NOTE — Assessment & Plan Note (Addendum)
 A1c of 6.8 at office visit 1 year ago.  At that time he opted for lifestyle changes.  He is quite resistant to medicine for his diabetes, but we did discuss it.  He has gained about 10 pounds since his last visit, so I suspect his A1c will be elevated still.  If medicines necessary, would for start with a GLP-1 agonist, particularly Mounjaro, which should improve his diabetes, weight, might help him get off blood pressure medicines 2. - A1c today

## 2024-01-16 NOTE — Assessment & Plan Note (Signed)
 Blood pressure 122/78.  This is goal.  Below 130/80 is best.  He has diabetes. - Continue amlodipine  5 daily and hydrochlorothiazide  25 daily. -BMP today

## 2024-01-16 NOTE — Progress Notes (Addendum)
   CC: Office visit, no acute concerns  HPI:  [name] is a 68 y.o. male with a PMH stated below who presents today for checkup.  Please see problem based assessment and plan for additional details.  Past Medical History:  Diagnosis Date   Hyperlipidemia    Hypertension    Numbness of arm 06/20/2016    Review of Systems: ROS negative except for what is noted on the assessment and plan.  Vitals:   01/16/24 1037  BP: 122/78  Pulse: 66  Temp: 97.8 F (36.6 C)  TempSrc: Oral  SpO2: 100%  Weight: 286 lb 12.8 oz (130.1 kg)  Height: 6' (1.829 m)   Physical Exam: Constitutional: well-appearing and in no acute distress Cardiovascular: regular rate and rhythm, no m/r/g Pulmonary/Chest: normal work of breathing on room air, lungs clear to auscultation bilaterally Abdominal: soft, non-tender, non-distended MSK: normal bulk and tone.  No lower extremity edema. Skin: warm and dry Psych: normal mood and behavior  Assessment & Plan:   Patient discussed with Dr. Francesco  Essential hypertension Blood pressure 122/78.  This is goal.  Below 130/80 is best.  He has diabetes. - Continue amlodipine  5 daily and hydrochlorothiazide  25 daily. -BMP today  Hyperlipidemia Repeat lipid panel today.  Primary prevention goal LDL below 100. - Continue Crestor  20 daily.  Type 2 diabetes mellitus (HCC) A1c of 6.8 at office visit 1 year ago.  At that time he opted for lifestyle changes.  He is quite resistant to medicine for his diabetes, but we did discuss it.  He has gained about 10 pounds since his last visit, so I suspect his A1c will be elevated still.  If medicines necessary, would for start with a GLP-1 agonist, particularly Mounjaro, which should improve his diabetes, weight, might help him get off blood pressure medicines 2. - A1c today  Healthcare maintenance Flu vaccine provided today.  Elevated PSA He is working with urology.  Morbid obesity (HCC) BMI is 39.  His  weight is 286 pounds.  This is about 10 pounds increase from a visit 1 year ago.  Spoke about weight loss.  Dietary intervention suggested include reduce sugary beverages, he likes to drink lemonade, reduce portion sizes, reduce eating out.  Needs a calorie deficit for weight loss.  RTC in 3 months for diabetes.  ADD: he is opposed to medicines for diabetes/weight loss but I have asked him to consider Mounjaro.  Lonni Africa, D.O. [facility] Internal Medicine, PGY-2 Phone: [phone] Date 01/16/2024 Time 11:49 AM

## 2024-01-16 NOTE — Assessment & Plan Note (Addendum)
 BMI is 39.  His weight is 286 pounds.  This is about 10 pounds increase from a visit 1 year ago.  Spoke about weight loss.  Dietary intervention suggested include reduce sugary beverages, he likes to drink lemonade, reduce portion sizes, reduce eating out.  Needs a calorie deficit for weight loss.

## 2024-01-16 NOTE — Assessment & Plan Note (Signed)
Flu vaccine provided today.

## 2024-01-16 NOTE — Assessment & Plan Note (Addendum)
 Repeat lipid panel today.  Primary prevention goal LDL below 100. - Continue Crestor  20 daily.

## 2024-01-16 NOTE — Assessment & Plan Note (Signed)
 He is working with urology.

## 2024-01-17 LAB — LIPID PANEL
Chol/HDL Ratio: 2.9 ratio (ref 0.0–5.0)
Cholesterol, Total: 163 mg/dL (ref 100–199)
HDL: 56 mg/dL (ref >39)
LDL Chol Calc (NIH): 91 mg/dL (ref 0–99)
Triglycerides: 88 mg/dL (ref 0–149)
VLDL Cholesterol Cal: 16 mg/dL (ref 5–40)

## 2024-01-17 LAB — HEMOGLOBIN A1C
Est. average glucose Bld gHb Est-mCnc: 146 mg/dL
Hgb A1c MFr Bld: 6.7 % — ABNORMAL HIGH (ref 4.8–5.6)

## 2024-01-17 LAB — BASIC METABOLIC PANEL WITH GFR
BUN/Creatinine Ratio: 10 (ref 10–24)
BUN: 13 mg/dL (ref 8–27)
CO2: 23 mmol/L (ref 20–29)
Calcium: 8.9 mg/dL (ref 8.6–10.2)
Chloride: 100 mmol/L (ref 96–106)
Creatinine, Ser: 1.25 mg/dL (ref 0.76–1.27)
Glucose: 113 mg/dL — ABNORMAL HIGH (ref 70–99)
Potassium: 3.7 mmol/L (ref 3.5–5.2)
Sodium: 138 mmol/L (ref 134–144)
eGFR: 63 mL/min/1.73 (ref >59)

## 2024-01-18 NOTE — Progress Notes (Signed)
 Internal Medicine Clinic Attending  Case discussed with the resident at the time of the visit.  We reviewed the resident's history and exam and pertinent patient test results.  I agree with the assessment, diagnosis, and plan of care documented in the resident's note.

## 2024-01-22 ENCOUNTER — Ambulatory Visit: Payer: Self-pay | Admitting: Student

## 2024-01-30 ENCOUNTER — Ambulatory Visit: Payer: Self-pay | Admitting: Student

## 2024-02-04 ENCOUNTER — Other Ambulatory Visit (HOSPITAL_COMMUNITY): Payer: Self-pay

## 2024-02-25 ENCOUNTER — Other Ambulatory Visit (HOSPITAL_COMMUNITY): Payer: Self-pay

## 2024-02-28 ENCOUNTER — Encounter: Payer: Self-pay | Admitting: Urology

## 2024-02-28 ENCOUNTER — Ambulatory Visit: Admitting: Urology

## 2024-02-28 VITALS — BP 146/80 | HR 77 | Ht 72.0 in | Wt 280.0 lb

## 2024-02-28 DIAGNOSIS — R972 Elevated prostate specific antigen [PSA]: Secondary | ICD-10-CM | POA: Diagnosis not present

## 2024-02-28 DIAGNOSIS — N138 Other obstructive and reflux uropathy: Secondary | ICD-10-CM

## 2024-02-28 DIAGNOSIS — Z87448 Personal history of other diseases of urinary system: Secondary | ICD-10-CM

## 2024-02-28 DIAGNOSIS — N529 Male erectile dysfunction, unspecified: Secondary | ICD-10-CM | POA: Diagnosis not present

## 2024-02-28 DIAGNOSIS — Z87898 Personal history of other specified conditions: Secondary | ICD-10-CM

## 2024-02-28 DIAGNOSIS — N401 Enlarged prostate with lower urinary tract symptoms: Secondary | ICD-10-CM

## 2024-02-28 LAB — URINALYSIS, ROUTINE W REFLEX MICROSCOPIC
Bilirubin, UA: NEGATIVE
Glucose, UA: NEGATIVE
Ketones, UA: NEGATIVE
Leukocytes,UA: NEGATIVE
Nitrite, UA: NEGATIVE
Protein,UA: NEGATIVE
RBC, UA: NEGATIVE
Specific Gravity, UA: 1.02 (ref 1.005–1.030)
Urobilinogen, Ur: 1 mg/dL (ref 0.2–1.0)
pH, UA: 7 (ref 5.0–7.5)

## 2024-02-28 NOTE — Progress Notes (Signed)
 Assessment: 1. BPH with obstruction/lower urinary tract symptoms   2. History of urinary retention   3. Elevated PSA   4. Organic impotence     Plan: Free and total PSA today Continue tamsulosin  0.4 mg twice daily. Continue finasteride  5 mg daily. Continue tadalafil  20 mg as needed. Return to office in 6 months  Chief Complaint:  Chief Complaint  Patient presents with   Benign Prostatic Hypertrophy    History of Present Illness:  Harold Hayes is a 68 y.o. male who is seen for further evaluation of BPH with lower urinary tract symptoms, history of urinary retention and elevated PSA. He presented to his PCP on 02/22/2023 with acute urinary retention.  He reported several days of difficulty voiding.  He reported increased frequency, urgency, nocturia, and hesitancy.  No prior episodes of retention.  Bladder scan demonstrated around 500 mL.  A Foley catheter was placed which initially drained 300 mL.  He was started on tamsulosin  and Bactrim .    He has a history of elevated PSA. PSA results: 2/20 6.5 11/23 11.0 8/24 10.0 10/24 44.7  No prior prostate biopsy.  No family history of prostate cancer. DRE 02/27/23:  50 g gland, difficult to feel base, NT His Foley was removed on 02/28/2023 after a successful voiding trial.  He continued on tamsulosin  and completed a course of Bactrim . He was voiding spontaneously after the catheter was removed.  He continued with some intermittent stream and decreased stream.  He also had frequency, urgency, and nocturia.  No gross hematuria.  He continued on tamsulosin  0.4 mg daily. PVR = 94 ml. His dose of tamsulosin  was increased to 0.4 mg twice daily.  MRI of the prostate from 04/15/2023 showed a prostate volume of 120 cm, no focal lesion of intermediate or higher suspicion for prostate cancer identified.  At his visit in 1/25, he was voiding spontaneously.  He continued on tamsulosin  twice daily.  His urinary symptoms were improved.  No  dysuria or gross hematuria. He was having problems achieving an erection.  He had tried sildenafil  100 mg on 1 or 2 occasions without significant improvement. IPSS = 3 PVR = 23 ml.  PSA 1/25:  13 with 15.9% free. PSA 4/25:  11.7  At his visit in April 2025, he continued on tamsulosin .  His lower urinary tract symptoms remained stable with nocturia x 3 and some urgency.  No dysuria or gross hematuria. IPSS = 4/1. He was started on finasteride  in April 2025. He continued to have problems achieving and maintaining his erections.  No improvement with sildenafil  100 mg as needed. He was given a trial of tadalafil  20 mg as needed.  At his visit in July 2025, he continued on tamsulosin  and finasteride  for management of his BPH.  His lower urinary tract symptoms were stable.  He continued with nocturia x 2.  No dysuria or gross hematuria. IPSS = 2/1. He tried the tadalafil  20 mg but did not see a significant improvement over sildenafil . He was not concerned about his erectile dysfunction due to his wife's health issues.  He returns today for follow-up.  He continues on tamsulosin  and finasteride .  His urinary symptoms remain well-controlled.  He does have nocturia 2-3 times.  No dysuria or gross hematuria. IPSS = 3/3.  Portions of the above documentation were copied from a prior visit for review purposes only.  Past Medical History:  Past Medical History:  Diagnosis Date   Hyperlipidemia    Hypertension  Numbness of arm 06/20/2016    Past Surgical History:  Past Surgical History:  Procedure Laterality Date   BIOPSY  04/23/2023   Procedure: BIOPSY;  Surgeon: Wilhelmenia Aloha Raddle., MD;  Location: [facility];  Service: Gastroenterology;;   COLONOSCOPY     ESOPHAGOGASTRODUODENOSCOPY (EGD) WITH PROPOFOL  N/A 04/23/2023   Procedure: ESOPHAGOGASTRODUODENOSCOPY (EGD) WITH PROPOFOL ;  Surgeon: Wilhelmenia Aloha Raddle., MD;  Location: [facility];  Service: Gastroenterology;  Laterality:  N/A;   EUS N/A 04/23/2023   Procedure: UPPER ENDOSCOPIC ULTRASOUND (EUS) RADIAL;  Surgeon: Wilhelmenia Aloha Raddle., MD;  Location: [facility];  Service: Gastroenterology;  Laterality: N/A;   FINE NEEDLE ASPIRATION N/A 04/23/2023   Procedure: FINE NEEDLE ASPIRATION (FNA) LINEAR;  Surgeon: Wilhelmenia Aloha Raddle., MD;  Location: [facility];  Service: Gastroenterology;  Laterality: N/A;   SPINE SURGERY      Allergies:  Allergies  Allergen Reactions   Lisinopril  Cough and Shortness Of Breath    Family History:  Family History  Problem Relation Age of Onset   Diabetes Mother    Diabetes Sister    Diabetes Sister    Hypertension Daughter    Colon cancer Neg Hx    Esophageal cancer Neg Hx    Rectal cancer Neg Hx    Stomach cancer Neg Hx    Colon polyps Neg Hx     Social History:  Social History   Tobacco Use   Smoking status: Former    Current packs/day: 0.00    Average packs/day: 1.5 packs/day for 20.0 years (30.0 ttl pk-yrs)    Types: Cigarettes    Start date: 05/08/1968    Quit date: 05/08/1988    Years since quitting: 35.8   Smokeless tobacco: Never  Vaping Use   Vaping status: Never Used  Substance Use Topics   Alcohol use: No   Drug use: No    Comment: 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    ROS: Constitutional:  Negative for fever, chills, weight loss CV: Negative for chest pain, previous MI, hypertension Respiratory:  Negative for shortness of breath, wheezing, sleep apnea, frequent cough GI:  Negative for nausea, vomiting, bloody stool, GERD   Physical exam: BP (!) 146/80   Pulse 77   Ht 6' (1.829 m)   Wt 280 lb (127 kg)   BMI 37.97 kg/m  GENERAL APPEARANCE:  Well appearing, well developed, well nourished, NAD HEENT:  Atraumatic, normocephalic, oropharynx clear NECK:  Supple without lymphadenopathy or thyromegaly ABDOMEN:  Soft, non-tender, no masses EXTREMITIES:  Moves all extremities well, without clubbing, cyanosis, or  edema NEUROLOGIC:  Alert and oriented x 3, normal gait, CN II-XII grossly intact MENTAL STATUS:  appropriate BACK:  Non-tender to palpation, No CVAT SKIN:  Warm, dry, and intact  Results: U/A: negative

## 2024-02-29 ENCOUNTER — Ambulatory Visit: Payer: Self-pay | Admitting: Urology

## 2024-02-29 LAB — PSA, TOTAL AND FREE
PSA, Free Pct: 16.4 %
PSA, Free: 1.2 ng/mL
Prostate Specific Ag, Serum: 7.3 ng/mL — ABNORMAL HIGH (ref 0.0–4.0)

## 2024-03-05 ENCOUNTER — Other Ambulatory Visit (HOSPITAL_COMMUNITY): Payer: Self-pay

## 2024-04-16 ENCOUNTER — Ambulatory Visit: Admitting: Student

## 2024-05-02 ENCOUNTER — Other Ambulatory Visit (HOSPITAL_COMMUNITY): Payer: Self-pay

## 2024-05-02 ENCOUNTER — Other Ambulatory Visit: Payer: Self-pay | Admitting: Student

## 2024-05-02 DIAGNOSIS — I10 Essential (primary) hypertension: Secondary | ICD-10-CM

## 2024-05-05 ENCOUNTER — Other Ambulatory Visit (HOSPITAL_COMMUNITY): Payer: Self-pay

## 2024-05-05 ENCOUNTER — Other Ambulatory Visit: Payer: Self-pay

## 2024-05-05 MED ORDER — HYDROCHLOROTHIAZIDE 25 MG PO TABS
25.0000 mg | ORAL_TABLET | Freq: Every day | ORAL | 0 refills | Status: AC
  Filled 2024-05-05: qty 90, 90d supply, fill #0

## 2024-05-05 MED ORDER — ROSUVASTATIN CALCIUM 20 MG PO TABS
20.0000 mg | ORAL_TABLET | Freq: Every day | ORAL | 0 refills | Status: AC
  Filled 2024-05-05: qty 90, 90d supply, fill #0

## 2024-05-05 MED ORDER — AMLODIPINE BESYLATE 5 MG PO TABS
5.0000 mg | ORAL_TABLET | Freq: Every day | ORAL | 0 refills | Status: AC
  Filled 2024-05-05: qty 90, 90d supply, fill #0

## 2024-05-05 NOTE — Telephone Encounter (Signed)
 Medication sent to pharmacy

## 2024-05-06 ENCOUNTER — Other Ambulatory Visit (HOSPITAL_COMMUNITY): Payer: Self-pay

## 2024-05-09 ENCOUNTER — Ambulatory Visit

## 2024-05-09 VITALS — BP 119/75 | HR 75 | Temp 97.7°F | Ht 72.0 in | Wt 283.8 lb

## 2024-05-09 DIAGNOSIS — Z8249 Family history of ischemic heart disease and other diseases of the circulatory system: Secondary | ICD-10-CM

## 2024-05-09 DIAGNOSIS — E119 Type 2 diabetes mellitus without complications: Secondary | ICD-10-CM

## 2024-05-09 DIAGNOSIS — Z833 Family history of diabetes mellitus: Secondary | ICD-10-CM | POA: Diagnosis not present

## 2024-05-09 DIAGNOSIS — Z79899 Other long term (current) drug therapy: Secondary | ICD-10-CM | POA: Diagnosis not present

## 2024-05-09 DIAGNOSIS — E785 Hyperlipidemia, unspecified: Secondary | ICD-10-CM

## 2024-05-09 DIAGNOSIS — I10 Essential (primary) hypertension: Secondary | ICD-10-CM | POA: Diagnosis not present

## 2024-05-09 LAB — POCT GLYCOSYLATED HEMOGLOBIN (HGB A1C): HbA1c, POC (controlled diabetic range): 6.4 % (ref 0.0–7.0)

## 2024-05-09 LAB — GLUCOSE, CAPILLARY: Glucose-Capillary: 101 mg/dL — ABNORMAL HIGH (ref 70–99)

## 2024-05-09 NOTE — Progress Notes (Signed)
 "  CC: Routine Follow Up for chronic health conditions after last office visit 01/16/2024  HPI:  Harold Hayes is a 69 y.o. male with pertinent PMH of DMII, HLD, HTN, elevated PSA, and obesity who presents for follow-up of his chronic conditions. Please see problem based assessment and plan for further history.  ROS  Medications: Current Outpatient Medications  Medication Instructions   amLODipine  (NORVASC ) 5 mg, Oral, Daily   finasteride  (PROSCAR ) 5 mg, Oral, Daily   hydrochlorothiazide  (HYDRODIURIL ) 25 mg, Oral, Daily   pantoprazole  (PROTONIX ) 40 mg, Oral, 2 times daily before meals   rosuvastatin  (CRESTOR ) 20 mg, Oral, Daily   tadalafil  (CIALIS ) 20 mg, Oral, Daily PRN   tamsulosin  (FLOMAX ) 0.4 mg, Oral, 2 times daily     Physical Exam:  There were no vitals filed for this visit.  Physical Exam Constitutional:      General: He is not in acute distress.    Appearance: He is not ill-appearing.  Cardiovascular:     Rate and Rhythm: Normal rate and regular rhythm.     Heart sounds: No murmur heard.    No friction rub. No gallop.  Pulmonary:     Effort: No respiratory distress.     Breath sounds: Normal breath sounds. No stridor. No wheezing or rales.  Abdominal:     General: There is no distension.     Tenderness: There is no abdominal tenderness.  Musculoskeletal:     Right lower leg: No edema.     Left lower leg: No edema.     Right foot: Normal range of motion. Bunion present. No deformity.     Left foot: Normal range of motion. Bunion present. No deformity.  Feet:     Right foot:     Protective Sensation: 5 sites tested.  5 sites sensed.     Skin integrity: Dry skin present. No ulcer, blister, skin breakdown or fissure.     Left foot:     Protective Sensation: 5 sites tested.  5 sites sensed.     Skin integrity: Dry skin present. No ulcer, blister, skin breakdown or fissure.     Comments: Proprioception and light sensation intact bilaterally. Neurological:      Mental Status: He is alert.       Assessment & Plan:   Assessment & Plan Type 2 diabetes mellitus without complication, without long-term current use of insulin (HCC) Reports no concerns regarding diabetes. Denies chest pain, shortness of breath, sensory deficit, polyuria or polydipsia. He does report that he has some poor vision that he uses reading glasses for. Foot exam performed today with bunions, no skin breakdown, and normal sensation. Not currently on any medications. A1C improved from last check three months ago. Reports that he is trying to limit his calories. Will continue with dietary management.  -Check UACR today -A1C checked today and results reviewed with the patient -Referral to ophthalmology -Foot exam completed and normal -Follow up in 6 months  Lab Results  Component Value Date   HGBA1C 6.4 05/09/2024   HGBA1C 6.7 (H) 01/16/2024   HGBA1C 6.8 (H) 01/02/2023    Essential hypertension He has a history of hypertension. His current regimen is Amlodipine  5mg  daily and hydrochlorothiazide  25mg  daily. He reports that he is adherent to his medications. He denies headaches, chest pain, shortness of breath, peripheral edema, and vision changes. His BP in the office today is 119/75, which is at goal for the patient.   - Continue amlodipine  5 mg daily  and hydrochlorothiazide  25 mg - Repeat BMP at follow-up in 6 months  Hyperlipidemia, unspecified hyperlipidemia type LDL last visit was 91.  His goal for his LDL is below 100 - Continue Crestor  20 mg daily  No orders of the defined types were placed in this encounter.    Patient discussed with Dr. Jone Dauphin   Melvenia Morrison, MD [facility] Internal [facility] Resident PGY-1 Clinic Phone: [phone] Please contact the on call pager at [phone] for any urgent or emergent needs.  "

## 2024-05-09 NOTE — Assessment & Plan Note (Signed)
 He has a history of hypertension. His current regimen is Amlodipine  5mg  daily and hydrochlorothiazide  25mg  daily. He reports that he is adherent to his medications. He denies headaches, chest pain, shortness of breath, peripheral edema, and vision changes. His BP in the office today is 119/75, which is at goal for the patient.   - Continue amlodipine  5 mg daily and hydrochlorothiazide  25 mg - Repeat BMP at follow-up in 6 months

## 2024-05-09 NOTE — Assessment & Plan Note (Signed)
 LDL last visit was 91.  His goal for his LDL is below 100 - Continue Crestor  20 mg daily

## 2024-05-09 NOTE — Patient Instructions (Addendum)
 Thank you, Mr. Harold Hayes, for allowing us  to provide your care today. Today we discussed . . .  > Diabetes       - Your diabetes is well-controlled today.  Your A1c is 6.4 keep up the good work.  I we will call you with the results of your urine to see if your kidneys are affected by her diabetes.  I have also referred you to an ophthalmologist to get your eyes checked.  Continue to work on dietary changes to help with your diabetes.  Your foot exam was normal. > Hypertension       - Your blood pressure is well-controlled today at 119/75.  Keep up the good work and keep taking your amlodipine  5 mg and your hydrochlorothiazide  25 mg.    I have ordered the following labs for you:   Lab Orders         Microalbumin / Creatinine Urine Ratio         Glucose, capillary         POC Hbg A1C       Referrals ordered today:    Referral Orders         Ambulatory referral to Ophthalmology       Follow up: 6 months    Remember:  Should you have any questions or concerns please call the internal medicine clinic at [phone].     Melvenia Morrison, [facility]

## 2024-05-09 NOTE — Progress Notes (Signed)
 Internal Medicine Clinic Attending  Case discussed with the resident at the time of the visit.  We reviewed the resident's history and exam and pertinent patient test results.  I agree with the assessment, diagnosis, and plan of care documented in the resident's note.

## 2024-05-09 NOTE — Assessment & Plan Note (Signed)
 Reports no concerns regarding diabetes. Denies chest pain, shortness of breath, sensory deficit, polyuria or polydipsia. He does report that he has some poor vision that he uses reading glasses for. Foot exam performed today with bunions, no skin breakdown, and normal sensation. Not currently on any medications. A1C improved from last check three months ago. Reports that he is trying to limit his calories. Will continue with dietary management.  -Check UACR today -A1C checked today and results reviewed with the patient -Referral to ophthalmology -Foot exam completed and normal -Follow up in 6 months  Lab Results  Component Value Date   HGBA1C 6.4 05/09/2024   HGBA1C 6.7 (H) 01/16/2024   HGBA1C 6.8 (H) 01/02/2023

## 2024-05-10 LAB — MICROALBUMIN / CREATININE URINE RATIO
Creatinine, Urine: 109.5 mg/dL
Microalb/Creat Ratio: <3 mg/g{creat} (ref 0–29)
Microalbumin, Urine: <3 ug/mL

## 2024-05-14 ENCOUNTER — Ambulatory Visit: Payer: Self-pay

## 2024-05-14 NOTE — Telephone Encounter (Signed)
 FYI Only or Action Required?: FYI only for provider: appointment scheduled on 05/15/24.  Patient was last seen in primary care on 05/09/2024 by Napoleon Limes, MD.  Called Nurse Triage reporting Abdominal Pain and Diarrhea.  Symptoms began 4 days ago.  Interventions attempted: OTC medications: [employer] brand metamucil.  Symptoms are: stable.  Triage Disposition: See Physician Within 24 Hours  Patient/caregiver understands and will follow disposition?: Yes          Copied from CRM #[id number]. Topic: Clinical - Red Word Triage >> May 14, 2024  9:38 AM Miquel SAILOR wrote: Red Word that prompted transfer to Nurse Triage: PT has Stomach issue/crapping/hard to hold bowl/diarrhea/burning/since 01/03.  had visit for another thing on 01/02. Reason for Disposition  [1] MODERATE diarrhea (e.g., 4-6 times / day more than normal) AND [2] present > 48 hours (2 days)  Answer Assessment - Initial Assessment Questions 1. DIARRHEA SEVERITY: How bad is the diarrhea? How many more stools have you had in the past 24 hours than normal?      6 episodes in the last 24 hours, normal is 2 BM per day. Patient states  he started taking laxatives yesterday, states the [employer] brand of Metamucil.  2. ONSET: When did the diarrhea begin?      05/10/24.  3. STOOL DESCRIPTION:  How loose or watery is the diarrhea? What is the stool color? Is there any blood or mucous in the stool?     Watery. Delores. No mucous or blood.  4. VOMITING: Are you also vomiting? If Yes, ask: How many times in the past 24 hours?      No.  5. ABDOMEN PAIN: Are you having any abdomen pain? If Yes, ask: What does it feel like? (e.g., crampy, dull, intermittent, constant)      Yes, lower abdominal pain. Sharp pain when going to the bathroom. Burning and cramping.  6. ABDOMEN PAIN SEVERITY: If present, ask: How bad is the pain?  (e.g., Scale 1-10; mild, moderate, or severe)     6-7/10 abdominal pain comes and goes.  7.  ORAL INTAKE: If vomiting, Have you been able to drink liquids? How much liquids have you had in the past 24 hours?     No vomiting.  8. HYDRATION: Any signs of dehydration? (e.g., dry mouth [not just dry lips], too weak to stand, dizziness, new weight loss) When did you last urinate?     No. He states he is drinking 2-3 water bottles daily. Last urinated this morning.  9. EXPOSURE: Have you traveled to a foreign country recently? Have you been exposed to anyone with diarrhea? Could you have eaten any food that was spoiled?     No.  10. ANTIBIOTIC USE: Are you taking antibiotics now or have you taken antibiotics in the past 2 months?       No.  11. OTHER SYMPTOMS: Do you have any other symptoms? (e.g., fever, blood in stool)       Night sweats, straining when having BM. No vomiting, fever, blood or black tarry stool.  He states he had a hot dog, pig feet, greens and beans the day he got sick.  Protocols used: [facility]

## 2024-05-15 ENCOUNTER — Other Ambulatory Visit: Payer: Self-pay

## 2024-05-15 ENCOUNTER — Encounter: Payer: Self-pay | Admitting: Student

## 2024-05-15 ENCOUNTER — Ambulatory Visit: Payer: Self-pay | Admitting: Student

## 2024-05-15 VITALS — BP 135/69 | HR 88 | Temp 97.7°F | Ht 72.0 in | Wt 281.4 lb

## 2024-05-15 DIAGNOSIS — K529 Noninfective gastroenteritis and colitis, unspecified: Secondary | ICD-10-CM

## 2024-05-15 NOTE — Progress Notes (Signed)
 Internal Medicine Clinic Attending  Case discussed with the resident at the time of the visit.  We reviewed the resident's history and exam and pertinent patient test results.  I agree with the assessment, diagnosis, and plan of care documented in the resident's note.

## 2024-05-15 NOTE — Progress Notes (Signed)
" ° °  CC: Diarrhea, now improving  HPI:  [name] is a 69 y.o. male with a PMH stated below who presents today for acute evaluation.   Please see problem based assessment and plan for additional details.  Past Medical History:  Diagnosis Date   Hyperlipidemia    Hypertension    Numbness of arm 06/20/2016   Review of Systems: ROS negative except for what is noted on the assessment and plan.  Vitals:   05/15/24 0905 05/15/24 0911  BP: (!) 142/71 135/69  Pulse: 96 88  Temp: 97.7 F (36.5 C)   TempSrc: Oral   SpO2: 100%   Weight: 281 lb 6.4 oz (127.6 kg)   Height: 6' (1.829 m)    Physical Exam: Constitutional: well-appearing man in no acute distress HENT: moist mucus membranes Cardiovascular: regular rate and rhythm, no m/r/g Abdominal: soft, non-tender, non-distended. Normal bowel sounds. No mass. MSK: normal bulk and tone Neurological: alert & oriented x 3, no focal deficit Skin: warm and dry. Turgor appropriate. Psych: normal mood and behavior  Assessment & Plan:   Patient discussed with Dr. Jeanelle Assessment & Plan Gastroenteritis 4 days of crampy abdominal pain and watery diarrhea that is now improving.  There is no fever, chills, emesis, blood per rectum.  He is tolerating food and drink, urinating normally.  He notes a hemorrhoid that is not bleeding.  It seems that prior to this he ate out multiple times as well as enjoyed some new use dinner leftovers.  Family ember provided him a dose of Linzess that helped him.  Overall he feels that he is improving but just wonders if there is anything else he needs to do to clear out his system.  Most consistent with a foodborne gastroenteritis that is resolving on its own. - He is doing well and improving appropriately, no further intervention is necessary - Sitz bath, incorporate fiber in the diet once acute diarrhea resolves - Work note provided  [employer], D.O. [facility],  PGY-2 Phone: [phone] Date 05/15/2024 Time 9:45 AM "

## 2024-05-22 ENCOUNTER — Other Ambulatory Visit (HOSPITAL_COMMUNITY): Payer: Self-pay

## 2024-05-22 ENCOUNTER — Other Ambulatory Visit: Payer: Self-pay

## 2024-05-22 ENCOUNTER — Other Ambulatory Visit: Payer: Self-pay | Admitting: Gastroenterology

## 2024-05-22 ENCOUNTER — Encounter (HOSPITAL_COMMUNITY): Payer: Self-pay

## 2024-05-28 ENCOUNTER — Telehealth: Payer: Self-pay

## 2024-05-28 NOTE — Telephone Encounter (Signed)
 Reminder was received in Epic Left message for pt to call back

## 2024-05-28 NOTE — Telephone Encounter (Signed)
-----   Message from Nurse Almarie HERO sent at 05/27/2024  8:25 AM EST -----  ----- Message ----- From: Anitra Odetta CROME, RN Sent: 05/27/2024  12:00 AM EST To: Almarie DELENA Goo, LPN   repeat MRI/MRCP in 1 year (can place under Dr. Lafonda name

## 2024-05-29 ENCOUNTER — Other Ambulatory Visit: Payer: Self-pay

## 2024-05-29 DIAGNOSIS — K862 Cyst of pancreas: Secondary | ICD-10-CM

## 2024-05-29 NOTE — Telephone Encounter (Signed)
 Order placed & mychart message sent to pt. Schedulers notified.

## 2024-06-04 NOTE — Telephone Encounter (Signed)
 Pt made aware of recommendations: Pt was scheduled for the MRI on 06/16/2024 at [facility] at 3:00 PM. Pt to arrive at 2:30 PM. Nothing to eat or drink prior. Pt made aware  Pt verbalized understanding with all questions answered.

## 2024-06-16 ENCOUNTER — Ambulatory Visit (HOSPITAL_COMMUNITY)

## 2024-08-28 ENCOUNTER — Ambulatory Visit: Admitting: Urology
